# Patient Record
Sex: Female | Born: 1945 | Race: White | Hispanic: No | Marital: Married | Smoking: Never smoker
Health system: Southern US, Community
[De-identification: ages and names within clinical notes are randomized; demographics above are authoritative.]

## PROBLEM LIST (undated history)

## (undated) DIAGNOSIS — J45909 Unspecified asthma, uncomplicated: Secondary | ICD-10-CM

## (undated) DIAGNOSIS — M1990 Unspecified osteoarthritis, unspecified site: Secondary | ICD-10-CM

## (undated) DIAGNOSIS — R112 Nausea with vomiting, unspecified: Secondary | ICD-10-CM

## (undated) DIAGNOSIS — H409 Unspecified glaucoma: Secondary | ICD-10-CM

## (undated) DIAGNOSIS — D649 Anemia, unspecified: Secondary | ICD-10-CM

## (undated) HISTORY — PX: ABDOMINAL HYSTERECTOMY: SHX81

## (undated) HISTORY — PX: BREAST CYST EXCISION: SHX579

## (undated) HISTORY — PX: SHOULDER ARTHROSCOPY: SHX128

---

## 1998-04-17 ENCOUNTER — Ambulatory Visit (HOSPITAL_COMMUNITY): Admission: RE | Admit: 1998-04-17 | Discharge: 1998-04-17 | Payer: Self-pay | Admitting: Family Medicine

## 1999-04-28 ENCOUNTER — Encounter: Payer: Self-pay | Admitting: Family Medicine

## 1999-04-28 ENCOUNTER — Ambulatory Visit (HOSPITAL_COMMUNITY): Admission: RE | Admit: 1999-04-28 | Discharge: 1999-04-28 | Payer: Self-pay | Admitting: Family Medicine

## 2000-05-23 ENCOUNTER — Encounter: Payer: Self-pay | Admitting: Family Medicine

## 2000-05-23 ENCOUNTER — Ambulatory Visit (HOSPITAL_COMMUNITY): Admission: RE | Admit: 2000-05-23 | Discharge: 2000-05-23 | Payer: Self-pay | Admitting: Family Medicine

## 2001-05-29 ENCOUNTER — Encounter: Payer: Self-pay | Admitting: Family Medicine

## 2001-05-29 ENCOUNTER — Ambulatory Visit (HOSPITAL_COMMUNITY): Admission: RE | Admit: 2001-05-29 | Discharge: 2001-05-29 | Payer: Self-pay | Admitting: Family Medicine

## 2002-07-16 ENCOUNTER — Encounter: Payer: Self-pay | Admitting: Family Medicine

## 2002-07-16 ENCOUNTER — Ambulatory Visit (HOSPITAL_COMMUNITY): Admission: RE | Admit: 2002-07-16 | Discharge: 2002-07-16 | Payer: Self-pay | Admitting: Family Medicine

## 2003-07-31 ENCOUNTER — Ambulatory Visit (HOSPITAL_COMMUNITY): Admission: RE | Admit: 2003-07-31 | Discharge: 2003-07-31 | Payer: Self-pay | Admitting: Family Medicine

## 2004-08-14 ENCOUNTER — Ambulatory Visit (HOSPITAL_COMMUNITY): Admission: RE | Admit: 2004-08-14 | Discharge: 2004-08-14 | Payer: Self-pay | Admitting: Family Medicine

## 2004-09-21 ENCOUNTER — Encounter: Admission: RE | Admit: 2004-09-21 | Discharge: 2004-09-21 | Payer: Self-pay | Admitting: Family Medicine

## 2005-08-30 ENCOUNTER — Ambulatory Visit (HOSPITAL_COMMUNITY): Admission: RE | Admit: 2005-08-30 | Discharge: 2005-08-30 | Payer: Self-pay | Admitting: Family Medicine

## 2006-09-06 ENCOUNTER — Ambulatory Visit (HOSPITAL_COMMUNITY): Admission: RE | Admit: 2006-09-06 | Discharge: 2006-09-06 | Payer: Self-pay | Admitting: Family Medicine

## 2007-09-08 ENCOUNTER — Ambulatory Visit (HOSPITAL_COMMUNITY): Admission: RE | Admit: 2007-09-08 | Discharge: 2007-09-08 | Payer: Self-pay | Admitting: Family Medicine

## 2008-09-20 ENCOUNTER — Ambulatory Visit (HOSPITAL_COMMUNITY): Admission: RE | Admit: 2008-09-20 | Discharge: 2008-09-20 | Payer: Self-pay | Admitting: Family Medicine

## 2009-10-07 ENCOUNTER — Ambulatory Visit (HOSPITAL_COMMUNITY): Admission: RE | Admit: 2009-10-07 | Discharge: 2009-10-07 | Payer: Self-pay | Admitting: Family Medicine

## 2010-10-30 ENCOUNTER — Other Ambulatory Visit (HOSPITAL_COMMUNITY): Payer: Self-pay | Admitting: *Deleted

## 2010-10-30 DIAGNOSIS — Z1231 Encounter for screening mammogram for malignant neoplasm of breast: Secondary | ICD-10-CM

## 2010-11-10 ENCOUNTER — Ambulatory Visit (HOSPITAL_COMMUNITY)
Admission: RE | Admit: 2010-11-10 | Discharge: 2010-11-10 | Disposition: A | Discharge status: Home / Self Care | Payer: Federal, State, Local not specified - PPO | Source: Ambulatory Visit | Attending: Family Medicine | Admitting: Family Medicine

## 2010-11-10 DIAGNOSIS — Z1231 Encounter for screening mammogram for malignant neoplasm of breast: Secondary | ICD-10-CM | POA: Insufficient documentation

## 2011-01-15 ENCOUNTER — Ambulatory Visit: Payer: Self-pay | Admitting: Gastroenterology

## 2011-03-26 ENCOUNTER — Ambulatory Visit: Payer: Self-pay | Admitting: Gastroenterology

## 2011-10-01 ENCOUNTER — Ambulatory Visit: Payer: Self-pay | Admitting: Gastroenterology

## 2011-11-10 ENCOUNTER — Other Ambulatory Visit (HOSPITAL_COMMUNITY): Payer: Self-pay | Admitting: Internal Medicine

## 2011-11-10 DIAGNOSIS — Z1231 Encounter for screening mammogram for malignant neoplasm of breast: Secondary | ICD-10-CM

## 2011-11-11 ENCOUNTER — Other Ambulatory Visit (HOSPITAL_COMMUNITY): Payer: Self-pay | Admitting: *Deleted

## 2011-11-11 DIAGNOSIS — Z1231 Encounter for screening mammogram for malignant neoplasm of breast: Secondary | ICD-10-CM

## 2011-12-14 ENCOUNTER — Other Ambulatory Visit (HOSPITAL_COMMUNITY): Payer: Self-pay | Admitting: Internal Medicine

## 2011-12-14 ENCOUNTER — Ambulatory Visit (HOSPITAL_COMMUNITY)
Admission: RE | Admit: 2011-12-14 | Discharge: 2011-12-14 | Disposition: A | Discharge status: Home / Self Care | Payer: Federal, State, Local not specified - PPO | Source: Ambulatory Visit | Attending: Internal Medicine | Admitting: Internal Medicine

## 2011-12-14 DIAGNOSIS — Z1231 Encounter for screening mammogram for malignant neoplasm of breast: Secondary | ICD-10-CM | POA: Insufficient documentation

## 2011-12-30 ENCOUNTER — Ambulatory Visit: Payer: Self-pay | Admitting: Surgery

## 2012-01-06 LAB — PATHOLOGY REPORT

## 2012-08-16 HISTORY — PX: CHOLECYSTECTOMY: SHX55

## 2012-11-28 ENCOUNTER — Other Ambulatory Visit (HOSPITAL_COMMUNITY): Payer: Self-pay | Admitting: Nurse Practitioner

## 2012-11-28 DIAGNOSIS — Z1231 Encounter for screening mammogram for malignant neoplasm of breast: Secondary | ICD-10-CM

## 2012-12-25 ENCOUNTER — Ambulatory Visit (HOSPITAL_COMMUNITY): Payer: Federal, State, Local not specified - PPO

## 2012-12-29 ENCOUNTER — Ambulatory Visit (HOSPITAL_COMMUNITY)
Admission: RE | Admit: 2012-12-29 | Discharge: 2012-12-29 | Disposition: A | Discharge status: Home / Self Care | Payer: Federal, State, Local not specified - PPO | Source: Ambulatory Visit | Attending: Nurse Practitioner | Admitting: Nurse Practitioner

## 2012-12-29 DIAGNOSIS — Z1231 Encounter for screening mammogram for malignant neoplasm of breast: Secondary | ICD-10-CM | POA: Insufficient documentation

## 2013-01-11 IMAGING — NM NUCLEAR MEDICINE HEPATOHBILIARY INCLUDE GB
3 series · 24 of 24 positions shown · non-contrast
Comparison: none

REASON FOR EXAM: RUQ abd pain
COMMENTS:

[Series 1000: gallbladder statics · 4.80mm/px · 11 of 11 slices shown (1 of 2)]
[im 1/11]
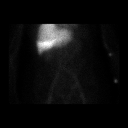
[im 2/11]
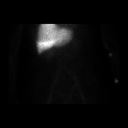
[im 3/11]
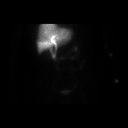
[im 4/11]
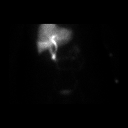
[im 5/11]
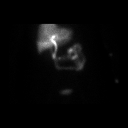
[im 6/11]
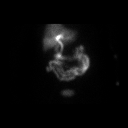
[im 7/11]
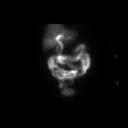
[im 8/11]
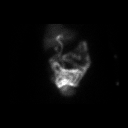
[im 9/11]
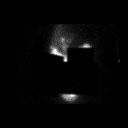
[im 10/11]
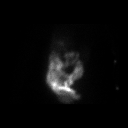
[im 11/11]
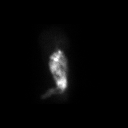

[Series 1000: gallbladder statics · 4.80mm/px · 1 of 1 slices shown (2 of 2)]
[im 1/1]
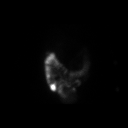

[Series 1000: gallbladder statics (concatenated) · 4.80mm/px · 12 of 12 slices shown]
[im 1/12]
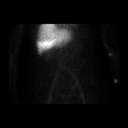
[im 2/12]
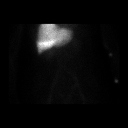
[im 3/12]
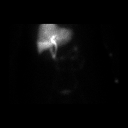
[im 4/12]
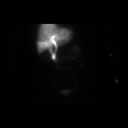
[im 5/12]
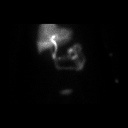
[im 6/12]
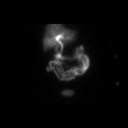
[im 7/12]
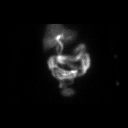
[im 8/12]
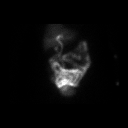
[im 9/12]
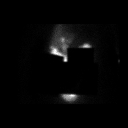
[im 10/12]
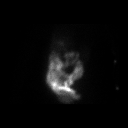
[im 11/12]
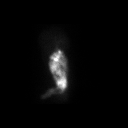
[im 12/12]
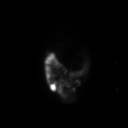

[24 of 24 positions shown; findings below may reference images not displayed]

PROCEDURE:     KNM - KNM HEPATOBILLIARY SCAN  - [DATE] [DATE] [DATE] [DATE]

RESULT:     The patient received 8.307 mCi of technetium 99m labeled
Choletec intravenously. There is adequate uptake of the radiopharmaceutical
by the liver. The common bile duct is visible by 10 minutes. There is
activity to the right of the common bile duct also visible by 10 minutes
that is of uncertain significance. Its contour does not suggest that of a
normal gallbladder. Bowel activity is clearly evident by 20 minutes. The
study was carried [DATE] minutes by which time the liver and common bile
duct exhibited no remaining activity. CCK was not administered.
IMPRESSION: I do not see definite visualization of the gallbladder. An
ultrasound performed today revealed a normal appearing partially distended
gallbladder without evidence of stones. It is possible that the low level
activity to the right of the common bile duct lies in the gallbladder but it
could also lie within the more proximal portion of the duodenum.

## 2013-12-25 ENCOUNTER — Other Ambulatory Visit (HOSPITAL_COMMUNITY): Payer: Self-pay | Admitting: Nurse Practitioner

## 2013-12-25 DIAGNOSIS — Z1231 Encounter for screening mammogram for malignant neoplasm of breast: Secondary | ICD-10-CM

## 2014-01-11 ENCOUNTER — Ambulatory Visit (HOSPITAL_COMMUNITY)
Admission: RE | Admit: 2014-01-11 | Discharge: 2014-01-11 | Disposition: A | Discharge status: Home / Self Care | Payer: Federal, State, Local not specified - PPO | Source: Ambulatory Visit | Attending: Nurse Practitioner | Admitting: Nurse Practitioner

## 2014-01-11 DIAGNOSIS — Z1231 Encounter for screening mammogram for malignant neoplasm of breast: Secondary | ICD-10-CM | POA: Insufficient documentation

## 2014-07-29 ENCOUNTER — Ambulatory Visit: Payer: Self-pay | Admitting: Surgery

## 2014-08-10 ENCOUNTER — Ambulatory Visit: Payer: Self-pay | Admitting: Surgery

## 2014-12-08 NOTE — Op Note (Signed)
PATIENT NAME:  Jacqueline Aguilar, Jacqueline Aguilar MR#:  [id number] DATE OF BIRTH:  01/08/1946  DATE OF PROCEDURE:  12/30/2011  PREOPERATIVE DIAGNOSIS: Cholecystitis.   POSTOPERATIVE DIAGNOSIS: Cholecystitis.   PROCEDURE: Laparoscopic cholecystectomy, cholangiogram.   SURGEON: Renda RollsWilton Calender, MD  ANESTHESIA: General.   INDICATIONS: This 69 year old female has a chief complaint of right upper abdominal pain which has been going on for more than two weeks. She had ultrasound which demonstrated no gallstones but had hepatobiliary scan which was with nonvisualization of the gallbladder. She had right upper quadrant tenderness found on physical exam and surgery was recommended for definitive treatment.   DESCRIPTION OF PROCEDURE: The patient was placed on the operating table in the supine position under general endotracheal anesthesia. The abdomen was prepared with ChloraPrep and draped in a sterile manner.   A short incision was made in the inferior aspect of the umbilicus and carried down to the deep fascia which was grasped with laryngeal hook and elevated. A Veress needle was inserted, aspirated, and irrigated with a saline solution. Next, the peritoneal cavity was insufflated with carbon dioxide. The Veress needle was removed. The 10 mm cannula was inserted. The 10 mm, 0 degree laparoscope was inserted to view the peritoneal cavity. There were some adhesions between the omentum and the lower abdominal wall. The liver appeared normal. Another incision was made in the epigastrium slightly to the right of the midline to introduce an 11 mm cannula. Two incisions were made in the lateral aspect of the right upper quadrant to introduce two 5 mm cannulas.   The gallbladder was retracted towards the right shoulder. Multiple adhesions were taken down with blunt and sharp dissection. The pouch of Jon BillingsMorrison was retracted inferiorly and laterally. The porta hepatis was demonstrated. The neck of the gallbladder was mobilized with  incision of the visceral peritoneum. The cystic duct was dissected free from surrounding structures. The cystic artery was dissected free from surrounding structures. A critical view of safety was demonstrated. An Endo Clip was placed across the cystic duct adjacent to the neck of the gallbladder. An incision was made in the cystic duct to introduce a Reddick catheter. Half-strength Conray-60 dye was injected as the cholangiogram demonstrated the biliary tree and prompt flow of dye into the duodenum. No retained stones were seen. The cholangiogram appeared normal. The Reddick catheter was removed. The cystic duct was doubly ligated with endoclips and divided. The cystic artery was controlled with double endoclips and divided. The gallbladder was dissected free from the liver with use of hook and cautery. One other small branch of the cystic artery was controlled with an endoclip and divided. The gallbladder was completely separated. Hemostasis was intact. The gallbladder was brought out through the infraumbilical incision and submitted in formalin for routine pathology. The right upper quadrant was further inspected. Hemostasis was intact. The cannulas were removed. Carbon dioxide was allowed to escape from the peritoneal cavity. There was some oozing from the inferior most 5 mm port site and these tissues were infiltrated with Xylocaine with epinephrine. Other incisions were infiltrated in a similar manner, pressure was held and hemostasis was subsequently intact. The wounds were closed with interrupted 5-0 chromic subcuticular sutures, benzoin, and Steri-Strips. Dressings were applied with paper tape. The patient tolerated surgery satisfactorily and was then prepared for transfer to the recovery room. ____________________________ Shela CommonsJ. Renda RollsWilton Martinezlopez, MD jws:slb D: 12/30/2011 10:15:02 ET Aguilar: 12/30/2011 10:24:15 ET JOB#: [id number]  cc: Adella HareJ. Wilton Roseboom, MD, <Dictator> Adella HareWILTON J Lessig MD ELECTRONICALLY SIGNED  12/31/2011 17:57 

## 2014-12-25 ENCOUNTER — Other Ambulatory Visit (HOSPITAL_COMMUNITY): Payer: Self-pay | Admitting: Nurse Practitioner

## 2014-12-25 DIAGNOSIS — Z1231 Encounter for screening mammogram for malignant neoplasm of breast: Secondary | ICD-10-CM

## 2015-01-17 ENCOUNTER — Other Ambulatory Visit (HOSPITAL_COMMUNITY): Payer: Self-pay | Admitting: Nurse Practitioner

## 2015-01-17 ENCOUNTER — Ambulatory Visit (HOSPITAL_COMMUNITY)
Admission: RE | Admit: 2015-01-17 | Discharge: 2015-01-17 | Disposition: A | Discharge status: Home / Self Care | Payer: Medicare Other | Source: Ambulatory Visit | Attending: Nurse Practitioner | Admitting: Nurse Practitioner

## 2015-01-17 DIAGNOSIS — Z1231 Encounter for screening mammogram for malignant neoplasm of breast: Secondary | ICD-10-CM

## 2016-01-14 ENCOUNTER — Other Ambulatory Visit: Payer: Self-pay

## 2016-01-14 DIAGNOSIS — Z1231 Encounter for screening mammogram for malignant neoplasm of breast: Secondary | ICD-10-CM

## 2016-01-30 ENCOUNTER — Ambulatory Visit
Admission: RE | Admit: 2016-01-30 | Discharge: 2016-01-30 | Disposition: A | Discharge status: Home / Self Care | Payer: Medicare Other | Source: Ambulatory Visit

## 2016-01-30 ENCOUNTER — Other Ambulatory Visit: Payer: Self-pay | Admitting: Nurse Practitioner

## 2016-01-30 DIAGNOSIS — Z1231 Encounter for screening mammogram for malignant neoplasm of breast: Secondary | ICD-10-CM

## 2016-12-28 ENCOUNTER — Other Ambulatory Visit: Payer: Self-pay | Admitting: Nurse Practitioner

## 2016-12-28 DIAGNOSIS — Z1231 Encounter for screening mammogram for malignant neoplasm of breast: Secondary | ICD-10-CM

## 2017-02-07 ENCOUNTER — Ambulatory Visit
Admission: RE | Admit: 2017-02-07 | Discharge: 2017-02-07 | Disposition: A | Discharge status: Home / Self Care | Payer: Medicare Other | Source: Ambulatory Visit | Attending: Nurse Practitioner | Admitting: Nurse Practitioner

## 2017-02-07 DIAGNOSIS — Z1231 Encounter for screening mammogram for malignant neoplasm of breast: Secondary | ICD-10-CM

## 2017-12-30 ENCOUNTER — Other Ambulatory Visit: Payer: Self-pay | Admitting: Nurse Practitioner

## 2017-12-30 DIAGNOSIS — Z1231 Encounter for screening mammogram for malignant neoplasm of breast: Secondary | ICD-10-CM

## 2018-02-13 ENCOUNTER — Ambulatory Visit
Admission: RE | Admit: 2018-02-13 | Discharge: 2018-02-13 | Disposition: A | Discharge status: Home / Self Care | Payer: Medicare Other | Source: Ambulatory Visit | Attending: Nurse Practitioner | Admitting: Nurse Practitioner

## 2018-02-13 DIAGNOSIS — Z1231 Encounter for screening mammogram for malignant neoplasm of breast: Secondary | ICD-10-CM

## 2019-01-25 ENCOUNTER — Other Ambulatory Visit: Payer: Self-pay | Admitting: Nurse Practitioner

## 2019-01-25 DIAGNOSIS — Z1231 Encounter for screening mammogram for malignant neoplasm of breast: Secondary | ICD-10-CM

## 2019-03-13 ENCOUNTER — Ambulatory Visit: Payer: Medicare Other

## 2019-04-26 ENCOUNTER — Ambulatory Visit
Admission: RE | Admit: 2019-04-26 | Discharge: 2019-04-26 | Disposition: A | Discharge status: Home / Self Care | Payer: Medicare Other | Source: Ambulatory Visit | Attending: Nurse Practitioner | Admitting: Nurse Practitioner

## 2019-04-26 ENCOUNTER — Other Ambulatory Visit: Payer: Self-pay

## 2019-04-26 DIAGNOSIS — Z1231 Encounter for screening mammogram for malignant neoplasm of breast: Secondary | ICD-10-CM

## 2019-09-18 ENCOUNTER — Other Ambulatory Visit: Payer: Self-pay | Admitting: Orthopedic Surgery

## 2019-09-18 DIAGNOSIS — M75112 Incomplete rotator cuff tear or rupture of left shoulder, not specified as traumatic: Secondary | ICD-10-CM

## 2019-09-28 ENCOUNTER — Ambulatory Visit
Admission: RE | Admit: 2019-09-28 | Discharge: 2019-09-28 | Disposition: A | Discharge status: Home / Self Care | Payer: Medicare Other | Source: Ambulatory Visit | Attending: Orthopedic Surgery | Admitting: Orthopedic Surgery

## 2019-09-28 ENCOUNTER — Other Ambulatory Visit: Payer: Self-pay

## 2019-09-28 DIAGNOSIS — M75112 Incomplete rotator cuff tear or rupture of left shoulder, not specified as traumatic: Secondary | ICD-10-CM

## 2019-10-18 ENCOUNTER — Other Ambulatory Visit: Payer: Self-pay | Admitting: Surgery

## 2019-10-26 ENCOUNTER — Encounter
Admission: RE | Admit: 2019-10-26 | Discharge: 2019-10-26 | Disposition: A | Discharge status: Home / Self Care | Payer: Medicare Other | Source: Ambulatory Visit | Attending: Surgery | Admitting: Surgery

## 2019-10-26 ENCOUNTER — Other Ambulatory Visit: Payer: Self-pay

## 2019-10-26 HISTORY — DX: Unspecified asthma, uncomplicated: J45.909

## 2019-10-26 HISTORY — DX: Unspecified glaucoma: H40.9

## 2019-10-26 HISTORY — DX: Nausea with vomiting, unspecified: R11.2

## 2019-10-26 NOTE — Patient Instructions (Signed)
Your procedure is scheduled on: Thursday November 01, 2019 Report to Day Surgery on the 2nd floor of the [facility]. To find out your arrival time, please call [phone] between 1PM - 3PM on: Wednesday October 31, 2019  REMEMBER: Instructions that are not followed completely may result in serious medical risk, up to and including death; or upon the discretion of your surgeon and anesthesiologist your surgery may need to be rescheduled.  Do not eat food after midnight the night before surgery.  No gum chewing, lozengers or hard candies.  You may however, drink CLEAR liquids up to 2 hours before you are scheduled to arrive for your surgery. Do not drink anything within 2 hours of the start of your surgery.  Clear liquids include: - water  - apple juice without pulp - gatorade (not RED) - black coffee or tea (Do NOT add milk or creamers to the coffee or tea) Do NOT drink anything that is not on this list.  Type 1 and Type 2 diabetics should only drink water.  ENSURE PRE-SURGERY CARBOHYDRATE DRINK:  Complete drinking 2 hours prior to scheduled arrival time.  TAKE THESE MEDICATIONS THE MORNING OF SURGERY WITH A SIP OF WATER: none   Use inhalers on the day of surgery   Stop Anti-inflammatories (NSAIDS) such as Advil, Aleve, Ibuprofen, Motrin, Naproxen, Naprosyn and  Aspirin  Or Aspirin based products such as Excedrin, Goodys Powder, BC Powder. (May take Tylenol or Acetaminophen if needed.)  Stop ANY OVER THE COUNTER supplements until after surgery. (May continue Vitamin D, Vitamin B, and multivitamin.)  No Alcohol for 24 hours before or after surgery.  No Smoking including e-cigarettes for 24 hours prior to surgery.  No chewable tobacco products for at least 6 hours prior to surgery.  No nicotine patches on the day of surgery.  On the morning of surgery brush your teeth with toothpaste and water, you may rinse your mouth with mouthwash if you wish. Do not swallow any toothpaste  or mouthwash.  Do not wear jewelry, make-up, hairpins, clips or nail polish.  Do not wear lotions, powders, or perfumes.   Do not shave 48 hours prior to surgery.   Contact lenses, hearing aids and dentures may not be worn into surgery.  Do not bring valuables to the hospital, including drivers license, insurance or credit cards.  [facility] is not responsible for any belongings or valuables.   Use CHG Soap  as directed on instruction sheet.  Notify your doctor if there is any change in your medical condition (cold, fever, infection).  Wear comfortable clothing (specific to your surgery type) to the hospital.  Plan for stool softeners for home use.  If you are being discharged the day of surgery, you will not be allowed to drive home. You will need a responsible adult to drive you home and stay with you that night.   If you are taking public transportation, you will need to have a responsible adult with you. Please confirm with your physician that it is acceptable to use public transportation.   Please call [phone] if you have any questions about these instructions.  Visitation Policy:  Patients undergoing a surgery or procedure in a hospital may have one family member or support person with them as long as that person is not COVID-19 positive or experiencing its symptoms. That person may remain in the waiting area during the procedure. Should the patient need to stay at the hospital during part of their  recovery, the support person may visit during visiting hours; 10 am to 8 pm.  Children under 67 years of age may have both parents or legal guardians with them during their hospital stay.

## 2019-10-30 ENCOUNTER — Other Ambulatory Visit: Payer: Self-pay

## 2019-10-30 ENCOUNTER — Encounter
Admission: RE | Admit: 2019-10-30 | Discharge: 2019-10-30 | Disposition: A | Discharge status: Home / Self Care | Payer: Medicare Other | Source: Ambulatory Visit | Attending: Surgery | Admitting: Surgery

## 2019-10-30 DIAGNOSIS — Z01818 Encounter for other preprocedural examination: Secondary | ICD-10-CM | POA: Diagnosis present

## 2019-10-30 DIAGNOSIS — Z20822 Contact with and (suspected) exposure to covid-19: Secondary | ICD-10-CM | POA: Insufficient documentation

## 2019-10-30 LAB — SARS CORONAVIRUS 2 (TAT 6-24 HRS): SARS Coronavirus 2: NEGATIVE

## 2019-11-01 ENCOUNTER — Encounter
Admission: RE | Disposition: A | Discharge status: Home / Self Care | Payer: Self-pay | Source: Home / Self Care | Attending: Surgery

## 2019-11-01 ENCOUNTER — Other Ambulatory Visit: Payer: Self-pay

## 2019-11-01 ENCOUNTER — Ambulatory Visit
Admission: RE | Admit: 2019-11-01 | Discharge: 2019-11-01 | Disposition: A | Discharge status: Home / Self Care | Payer: Medicare Other | Attending: Surgery | Admitting: Surgery

## 2019-11-01 ENCOUNTER — Ambulatory Visit: Payer: Medicare Other | Admitting: Anesthesiology

## 2019-11-01 ENCOUNTER — Ambulatory Visit: Payer: Medicare Other

## 2019-11-01 ENCOUNTER — Encounter: Payer: Self-pay | Admitting: Surgery

## 2019-11-01 DIAGNOSIS — M1990 Unspecified osteoarthritis, unspecified site: Secondary | ICD-10-CM | POA: Diagnosis not present

## 2019-11-01 DIAGNOSIS — Z882 Allergy status to sulfonamides status: Secondary | ICD-10-CM | POA: Diagnosis not present

## 2019-11-01 DIAGNOSIS — Z79899 Other long term (current) drug therapy: Secondary | ICD-10-CM | POA: Insufficient documentation

## 2019-11-01 DIAGNOSIS — M7522 Bicipital tendinitis, left shoulder: Secondary | ICD-10-CM | POA: Insufficient documentation

## 2019-11-01 DIAGNOSIS — M25812 Other specified joint disorders, left shoulder: Secondary | ICD-10-CM | POA: Insufficient documentation

## 2019-11-01 DIAGNOSIS — Z419 Encounter for procedure for purposes other than remedying health state, unspecified: Secondary | ICD-10-CM

## 2019-11-01 DIAGNOSIS — J45909 Unspecified asthma, uncomplicated: Secondary | ICD-10-CM | POA: Diagnosis not present

## 2019-11-01 DIAGNOSIS — Z888 Allergy status to other drugs, medicaments and biological substances status: Secondary | ICD-10-CM | POA: Diagnosis not present

## 2019-11-01 DIAGNOSIS — M7502 Adhesive capsulitis of left shoulder: Secondary | ICD-10-CM | POA: Insufficient documentation

## 2019-11-01 DIAGNOSIS — M75112 Incomplete rotator cuff tear or rupture of left shoulder, not specified as traumatic: Secondary | ICD-10-CM | POA: Diagnosis present

## 2019-11-01 DIAGNOSIS — H409 Unspecified glaucoma: Secondary | ICD-10-CM | POA: Diagnosis not present

## 2019-11-01 HISTORY — PX: SHOULDER ARTHROSCOPY WITH SUBACROMIAL DECOMPRESSION AND OPEN ROTATOR C: SHX5688

## 2019-11-01 SURGERY — SHOULDER ARTHROSCOPY WITH SUBACROMIAL DECOMPRESSION AND OPEN ROTATOR CUFF REPAIR, OPEN BICEPS TENDON REPAIR
Anesthesia: General | Site: Shoulder | Laterality: Left

## 2019-11-01 MED ORDER — MIDAZOLAM HCL 2 MG/2ML IJ SOLN
INTRAMUSCULAR | Status: AC
  Filled 2019-11-01: qty 2

## 2019-11-01 MED ORDER — PROMETHAZINE HCL 25 MG/ML IJ SOLN: 6.2500 mg | INTRAMUSCULAR | Status: DC | PRN

## 2019-11-01 MED ORDER — ONDANSETRON HCL 4 MG/2ML IJ SOLN
INTRAMUSCULAR | Status: AC
  Filled 2019-11-01: qty 2

## 2019-11-01 MED ORDER — ROCURONIUM BROMIDE 10 MG/ML (PF) SYRINGE
PREFILLED_SYRINGE | INTRAVENOUS | Status: AC
  Filled 2019-11-01: qty 10

## 2019-11-01 MED ORDER — ROCURONIUM BROMIDE 100 MG/10ML IV SOLN
INTRAVENOUS | Status: DC | PRN
  Administered 2019-11-01: 30 mg via INTRAVENOUS

## 2019-11-01 MED ORDER — SUGAMMADEX SODIUM 200 MG/2ML IV SOLN
INTRAVENOUS | Status: DC | PRN
  Administered 2019-11-01: 200 mg via INTRAVENOUS

## 2019-11-01 MED ORDER — CEFAZOLIN SODIUM-DEXTROSE 2-4 GM/100ML-% IV SOLN
2.0000 g | INTRAVENOUS | Status: AC
  Administered 2019-11-01: 2 g via INTRAVENOUS

## 2019-11-01 MED ORDER — BUPIVACAINE HCL (PF) 0.5 % IJ SOLN
INTRAMUSCULAR | Status: AC
  Filled 2019-11-01: qty 30

## 2019-11-01 MED ORDER — FAMOTIDINE 20 MG PO TABS
ORAL_TABLET | ORAL | Status: AC
  Administered 2019-11-01: 11:00:00 20 mg via ORAL
  Filled 2019-11-01: qty 1

## 2019-11-01 MED ORDER — LIDOCAINE HCL (CARDIAC) PF 100 MG/5ML IV SOSY
PREFILLED_SYRINGE | INTRAVENOUS | Status: DC | PRN
  Administered 2019-11-01: 40 mg via INTRAVENOUS

## 2019-11-01 MED ORDER — BUPIVACAINE HCL (PF) 0.5 % IJ SOLN
INTRAMUSCULAR | Status: DC | PRN
  Administered 2019-11-01: 10 mL via PERINEURAL

## 2019-11-01 MED ORDER — CHLORHEXIDINE GLUCONATE 4 % EX LIQD
60.0000 mL | Freq: Once | CUTANEOUS | Status: AC
  Administered 2019-11-01: 4 via TOPICAL

## 2019-11-01 MED ORDER — PROPOFOL 10 MG/ML IV BOLUS
INTRAVENOUS | Status: DC | PRN
  Administered 2019-11-01: 120 mg via INTRAVENOUS

## 2019-11-01 MED ORDER — BUPIVACAINE HCL (PF) 0.5 % IJ SOLN
INTRAMUSCULAR | Status: AC
  Filled 2019-11-01: qty 10

## 2019-11-01 MED ORDER — LACTATED RINGERS IV SOLN
INTRAVENOUS | Status: DC | PRN
  Administered 2019-11-01: 3000 mL

## 2019-11-01 MED ORDER — BUPIVACAINE LIPOSOME 1.3 % IJ SUSP
INTRAMUSCULAR | Status: DC | PRN
  Administered 2019-11-01: 15 mL via PERINEURAL

## 2019-11-01 MED ORDER — LIDOCAINE HCL (PF) 1 % IJ SOLN
INTRAMUSCULAR | Status: AC
  Filled 2019-11-01: qty 5

## 2019-11-01 MED ORDER — EPINEPHRINE PF 1 MG/ML IJ SOLN
INTRAMUSCULAR | Status: AC
  Filled 2019-11-01: qty 1

## 2019-11-01 MED ORDER — DEXAMETHASONE SODIUM PHOSPHATE 10 MG/ML IJ SOLN
INTRAMUSCULAR | Status: DC | PRN
  Administered 2019-11-01: 10 mg via INTRAVENOUS

## 2019-11-01 MED ORDER — ACETAMINOPHEN 325 MG PO TABS: 325.0000 mg | ORAL_TABLET | ORAL | Status: DC | PRN

## 2019-11-01 MED ORDER — LIDOCAINE HCL (PF) 2 % IJ SOLN
INTRAMUSCULAR | Status: AC
  Filled 2019-11-01: qty 10

## 2019-11-01 MED ORDER — SODIUM CHLORIDE 0.9 % IV SOLN
INTRAVENOUS | Status: DC | PRN
  Administered 2019-11-01: 30 ug/min via INTRAVENOUS

## 2019-11-01 MED ORDER — BUPIVACAINE-EPINEPHRINE 0.5% -1:200000 IJ SOLN
INTRAMUSCULAR | Status: DC | PRN
  Administered 2019-11-01: 30 mL

## 2019-11-01 MED ORDER — FENTANYL CITRATE (PF) 100 MCG/2ML IJ SOLN
INTRAMUSCULAR | Status: AC
  Administered 2019-11-01: 50 ug
  Filled 2019-11-01: qty 2

## 2019-11-01 MED ORDER — FENTANYL CITRATE (PF) 100 MCG/2ML IJ SOLN
INTRAMUSCULAR | Status: DC | PRN
  Administered 2019-11-01: 50 ug via INTRAVENOUS

## 2019-11-01 MED ORDER — PROPOFOL 10 MG/ML IV BOLUS
INTRAVENOUS | Status: AC
  Filled 2019-11-01: qty 20

## 2019-11-01 MED ORDER — ACETAMINOPHEN 10 MG/ML IV SOLN
INTRAVENOUS | Status: DC | PRN
  Administered 2019-11-01: 1000 mg via INTRAVENOUS

## 2019-11-01 MED ORDER — CEFAZOLIN SODIUM-DEXTROSE 2-4 GM/100ML-% IV SOLN
INTRAVENOUS | Status: AC
  Filled 2019-11-01: qty 100

## 2019-11-01 MED ORDER — DEXAMETHASONE SODIUM PHOSPHATE 10 MG/ML IJ SOLN
INTRAMUSCULAR | Status: AC
  Filled 2019-11-01: qty 1

## 2019-11-01 MED ORDER — ONDANSETRON HCL 4 MG/2ML IJ SOLN
INTRAMUSCULAR | Status: DC | PRN
  Administered 2019-11-01: 4 mg via INTRAVENOUS

## 2019-11-01 MED ORDER — BUPIVACAINE LIPOSOME 1.3 % IJ SUSP
INTRAMUSCULAR | Status: AC
  Filled 2019-11-01: qty 20

## 2019-11-01 MED ORDER — FAMOTIDINE 20 MG PO TABS: 20.0000 mg | ORAL_TABLET | Freq: Once | ORAL | Status: AC

## 2019-11-01 MED ORDER — LACTATED RINGERS IV SOLN: INTRAVENOUS | Status: DC

## 2019-11-01 MED ORDER — OXYCODONE HCL 5 MG PO TABS: 5.0000 mg | ORAL_TABLET | ORAL | 0 refills | Status: AC | PRN

## 2019-11-01 MED ORDER — FENTANYL CITRATE (PF) 100 MCG/2ML IJ SOLN
INTRAMUSCULAR | Status: AC
  Filled 2019-11-01: qty 2

## 2019-11-01 MED ORDER — ACETAMINOPHEN 160 MG/5ML PO SOLN
325.0000 mg | ORAL | Status: DC | PRN
  Filled 2019-11-01: qty 20.3

## 2019-11-01 MED ORDER — HYDROCODONE-ACETAMINOPHEN 7.5-325 MG PO TABS: 1.0000 | ORAL_TABLET | Freq: Once | ORAL | Status: DC | PRN

## 2019-11-01 MED ORDER — PHENYLEPHRINE HCL (PRESSORS) 10 MG/ML IV SOLN
INTRAVENOUS | Status: DC | PRN
  Administered 2019-11-01 (×2): 100 ug via INTRAVENOUS

## 2019-11-01 SURGICAL SUPPLY — 55 items
ANCH SUT BN ASCP DLV (Anchor) ×1 IMPLANT
ANCH SUT RGNRT REGENETEN (Staple) ×1 IMPLANT
ANCHOR BONE REGENETEN (Anchor) ×2 IMPLANT
ANCHOR TENDON REGENETEN (Staple) ×2 IMPLANT
APL PRP STRL LF DISP 70% ISPRP (MISCELLANEOUS) ×1
BIT DRILL JUGRKNT W/NDL BIT2.9 (DRILL) IMPLANT
BLADE FULL RADIUS 3.5 (BLADE) ×3 IMPLANT
BUR ACROMIONIZER 4.0 (BURR) ×3 IMPLANT
CANNULA SHAVER 8MMX76MM (CANNULA) ×3 IMPLANT
CHLORAPREP W/TINT 26 (MISCELLANEOUS) ×3 IMPLANT
COUNTER NEEDLE 20/40 LG (NEEDLE) ×2 IMPLANT
COVER MAYO STAND REUSABLE (DRAPES) ×3 IMPLANT
COVER WAND RF STERILE (DRAPES) ×3 IMPLANT
DRAPE IMP U-DRAPE 54X76 (DRAPES) ×10 IMPLANT
DRILL JUGGERKNOT W/NDL BIT 2.9 (DRILL) ×6
ELECT CAUTERY BLADE TIP 2.5 (TIP) ×3
ELECT REM PT RETURN 9FT ADLT (ELECTROSURGICAL) ×3
ELECTRODE CAUTERY BLDE TIP 2.5 (TIP) ×1 IMPLANT
ELECTRODE REM PT RTRN 9FT ADLT (ELECTROSURGICAL) ×1 IMPLANT
GAUZE SPONGE 4X4 12PLY STRL (GAUZE/BANDAGES/DRESSINGS) ×3 IMPLANT
GAUZE XEROFORM 1X8 LF (GAUZE/BANDAGES/DRESSINGS) ×3 IMPLANT
GLOVE BIO SURGEON STRL SZ7.5 (GLOVE) ×6 IMPLANT
GLOVE BIO SURGEON STRL SZ8 (GLOVE) ×6 IMPLANT
GLOVE BIOGEL PI IND STRL 8 (GLOVE) ×1 IMPLANT
GLOVE BIOGEL PI INDICATOR 8 (GLOVE) ×2
GLOVE INDICATOR 8.0 STRL GRN (GLOVE) ×3 IMPLANT
GOWN STRL REUS W/ TWL LRG LVL3 (GOWN DISPOSABLE) ×1 IMPLANT
GOWN STRL REUS W/ TWL XL LVL3 (GOWN DISPOSABLE) ×1 IMPLANT
GOWN STRL REUS W/TWL LRG LVL3 (GOWN DISPOSABLE) ×3
GOWN STRL REUS W/TWL XL LVL3 (GOWN DISPOSABLE) ×3
GRASPER SUT 15 45D LOW PRO (SUTURE) IMPLANT
IMPL REGENETEN MEDIUM (Shoulder) IMPLANT
IMPLANT REGENETEN MEDIUM (Shoulder) ×3 IMPLANT
IV LACTATED RINGER IRRG 3000ML (IV SOLUTION) ×6
IV LR IRRIG 3000ML ARTHROMATIC (IV SOLUTION) ×2 IMPLANT
KIT CANNULA 8X76-LX IN CANNULA (CANNULA) IMPLANT
MANIFOLD NEPTUNE II (INSTRUMENTS) ×3 IMPLANT
MASK FACE SPIDER DISP (MASK) ×3 IMPLANT
MAT ABSORB  FLUID 56X50 GRAY (MISCELLANEOUS) ×3
MAT ABSORB FLUID 56X50 GRAY (MISCELLANEOUS) ×1 IMPLANT
PACK ARTHROSCOPY SHOULDER (MISCELLANEOUS) ×3 IMPLANT
PASSER SUT FIRSTPASS SELF (INSTRUMENTS) IMPLANT
SLING ARM LRG DEEP (SOFTGOODS) ×1 IMPLANT
SLING ULTRA II LG (MISCELLANEOUS) ×3 IMPLANT
STAPLER SKIN PROX 35W (STAPLE) ×3 IMPLANT
STRAP SAFETY 5IN WIDE (MISCELLANEOUS) ×3 IMPLANT
SUT ETHIBOND 0 MO6 C/R (SUTURE) ×3 IMPLANT
SUT ULTRABRAID 2 COBRAID 38 (SUTURE) IMPLANT
SUT VIC AB 2-0 CT1 27 (SUTURE) ×6
SUT VIC AB 2-0 CT1 TAPERPNT 27 (SUTURE) ×2 IMPLANT
TAPE MICROFOAM 4IN (TAPE) ×3 IMPLANT
TUBING ARTHRO INFLOW-ONLY STRL (TUBING) ×3 IMPLANT
TUBING CONNECTING 10 (TUBING) ×2 IMPLANT
TUBING CONNECTING 10' (TUBING) ×1
WAND WEREWOLF FLOW 90D (MISCELLANEOUS) ×3 IMPLANT

## 2019-11-01 NOTE — Discharge Instructions (Addendum)
Orthopedic discharge instructions: Keep dressing dry and intact.  May shower after dressing changed on post-op day #4 (Monday).  Cover staples with Band-Aids after drying off. Apply ice frequently to shoulder. Take ibuprofen 600 mg TID with meals for 7-10 days, then as necessary. Take oxycodone as prescribed when needed.  May supplement with ES Tylenol if necessary. Keep shoulder immobilizer on at all times except may remove for bathing purposes. Follow-up in 10-14 days or as scheduled.  AMBULATORY SURGERY  DISCHARGE INSTRUCTIONS   1) The drugs that you were given will stay in your system until tomorrow so for the next 24 hours you should not:  A) Drive an automobile B) Make any legal decisions C) Drink any alcoholic beverage   2) You may resume regular meals tomorrow.  Today it is better to start with liquids and gradually work up to solid foods.  You may eat anything you prefer, but it is better to start with liquids, then soup and crackers, and gradually work up to solid foods.   3) Please notify your doctor immediately if you have any unusual bleeding, trouble breathing, redness and pain at the surgery site, drainage, fever, or pain not relieved by medication.   4) Additional Instructions:   Please contact your physician with any problems or Same Day Surgery at [phone], Monday through Friday 6 am to 4 pm, or [facility] number at [phone].      Interscalene Nerve Block with Exparel  1.  For your surgery you have received an Interscalene Nerve Block with Exparel. 2. Nerve Blocks affect many types of nerves, including nerves that control movement, pain and normal sensation.  You may experience feelings such as numbness, tingling, heaviness, weakness or the inability to move your arm or the feeling or sensation that your arm has "fallen asleep". 3. A nerve block with Exparel can last up to 5 days.  Usually the weakness wears off first.  The tingling  and heaviness usually wear off next.  Finally you may start to notice pain.  Keep in mind that this may occur in any order.  Once a nerve block starts to wear off it is usually completely gone within 60 minutes. 4. ISNB may cause mild shortness of breath, a hoarse voice, blurry vision, unequal pupils, or drooping of the face on the same side as the nerve block.  These symptoms will usually resolve with the numbness.  Very rarely the procedure itself can cause mild seizures. 5. If needed, your surgeon will give you a prescription for pain medication.  It will take about 60 minutes for the oral pain medication to become fully effective.  So, it is recommended that you start taking this medication before the nerve block first begins to wear off, or when you first begin to feel discomfort. 6. Take your pain medication only as prescribed.  Pain medication can cause sedation and decrease your breathing if you take more than you need for the level of pain that you have. 7. Nausea is a common side effect of many pain medications.  You may want to eat something before taking your pain medicine to prevent nausea. 8. After an Interscalene nerve block, you cannot feel pain, pressure or extremes in temperature in the effected arm.  Because your arm is numb it is at an increased risk for injury.  To decrease the possibility of injury, please practice the following:  a. While you are awake change the position of your arm frequently  to prevent too much pressure on any one area for prolonged periods of time. b.  If you have a cast or tight dressing, check the color or your fingers every couple of hours.  Call your surgeon with the appearance of any discoloration (white or blue). c. If you are given a sling to wear before you go home, please wear it  at all times until the block has completely worn off.  Do not get up at night without your sling. d. Please contact [facility] or your surgeon if you do not begin to  regain sensation after 7 days from the surgery.  Anesthesia may be contacted by calling the Same Day Surgery Department, Mon. through Fri., 6 am to 4 pm at [phone].   e. If you experience any other problems or concerns, please contact your surgeon's office. f. If you experience severe or prolonged shortness of breath go to the nearest emergency department.

## 2019-11-01 NOTE — H&P (Signed)
Paper H&P to be scanned into permanent record. H&P reviewed and patient re-examined. No changes. 

## 2019-11-01 NOTE — Anesthesia Procedure Notes (Signed)
Procedure Name: Intubation Date/Time: 11/01/2019 1:16 PM Performed by: Caryl Asp, CRNA Pre-anesthesia Checklist: Patient identified, Patient being monitored, Timeout performed, Emergency Drugs available and Suction available Patient Re-evaluated:Patient Re-evaluated prior to induction Oxygen Delivery Method: Circle system utilized Preoxygenation: Pre-oxygenation with 100% oxygen Induction Type: IV induction Ventilation: Mask ventilation without difficulty and Oral airway inserted - appropriate to patient size Laryngoscope Size: Mac and 3 Grade View: Grade I Tube type: Oral Tube size: 7.0 mm Number of attempts: 1 Airway Equipment and Method: Stylet and Video-laryngoscopy Placement Confirmation: ETT inserted through vocal cords under direct vision,  positive ETCO2 and breath sounds checked- equal and bilateral Secured at: 20 cm Tube secured with: Tape Dental Injury: Teeth and Oropharynx as per pre-operative assessment

## 2019-11-01 NOTE — Anesthesia Preprocedure Evaluation (Signed)
Anesthesia Evaluation  Patient identified by MRN, date of birth, ID band Patient awake    Reviewed: Allergy & Precautions, H&P , NPO status , reviewed documented beta blocker date and time   History of Anesthesia Complications (+) PONV and history of anesthetic complications  Airway Mallampati: II  TM Distance: >3 FB Neck ROM: full    Dental  (+) Upper Dentures, Partial Lower, Chipped, Missing   Pulmonary asthma ,    Pulmonary exam normal        Cardiovascular Normal cardiovascular exam     Neuro/Psych    GI/Hepatic neg GERD  ,  Endo/Other    Renal/GU      Musculoskeletal   Abdominal   Peds  Hematology   Anesthesia Other Findings Past Medical History: No date: Asthma No date: Glaucoma No date: PONV (postoperative nausea and vomiting) Past Surgical History: No date: ABDOMINAL HYSTERECTOMY No date: BREAST CYST EXCISION; Left     Comment:  Years ago, No scar seen  2014: CHOLECYSTECTOMY No date: SHOULDER ARTHROSCOPY; Left BMI    Body Mass Index: 25.79 kg/m     Reproductive/Obstetrics                             Anesthesia Physical Anesthesia Plan  ASA: II  Anesthesia Plan: General   Post-op Pain Management:  Regional for Post-op pain   Induction: Intravenous  PONV Risk Score and Plan: Ondansetron and Treatment may vary due to age or medical condition  Airway Management Planned: Oral ETT  Additional Equipment:   Intra-op Plan:   Post-operative Plan: Extubation in OR  Informed Consent: I have reviewed the patients History and Physical, chart, labs and discussed the procedure including the risks, benefits and alternatives for the proposed anesthesia with the patient or authorized representative who has indicated his/her understanding and acceptance.     Dental Advisory Given  Plan Discussed with: CRNA  Anesthesia Plan Comments:         Anesthesia Quick  Evaluation

## 2019-11-01 NOTE — Op Note (Signed)
11/01/2019  2:49 PM  Patient:   Jacqueline Aguilar  Pre-Op Diagnosis:   Impingement/tendinopathy with nontraumatic partial-thickness rotator cuff tear and biceps tendinopathy, left shoulder.  Post-Op Diagnosis:   Impingement/tendinopathy with nontraumatic partial-thickness rotator cuff tear, adhesive capsulitis, and biceps tendinopathy, left shoulder.  Procedure:   Extensive arthroscopic debridement, arthroscopic subacromial decompression, mini-open rotator cuff repair using a Lumbra & Nephew Regeneten patch, manipulation under anesthesia, and mini-open biceps tenodesis, left shoulder.  Anesthesia:   General endotracheal with interscalene block using Exparel placed preoperatively by the anesthesiologist.  Surgeon:   Pascal Lux, MD  Assistant:   Cameron Proud, PA-C  Findings:   As above. There was a bursal-sided partial-thickness tear involving the mid portion of the supraspinatus tendon. The remainder the rotator cuff was in satisfactory condition. There was extensive inflammation along the biceps tendon without any partial or full-thickness tearing. 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 The remainder of the articular surfaces were in satisfactory condition, as was the labrum.  Prior to manipulation, the left shoulder could be forward flexed to 145 and abducted to 135. At 90 of abduction, the shoulder could be externally rotated to 70 and internally rotated to 60. Following manipulation, the shoulder could be forward flexed to 165, abducted to 160 and, at 90 of abduction, externally rotated to 90 and internally rotated to 65.  Complications:   None  Fluids:   700 cc  Estimated blood loss:   5 cc  Tourniquet time:   None  Drains:   None  Closure:   Staples      Brief clinical note:   The patient is a 74 year old female with a history of progressively worsening left  shoulder pain and limited motion. The patient's symptoms have progressed despite medications, activity modification, etc. The patient's history and examination are consistent with impingement/tendinopathy with a possible rotator cuff tear. The preoperative MRI scan also indicated the presence of a bursal sided partial-thickness rotator cuff tear. The patient presents at this time for definitive management of these shoulder symptoms.  Procedure:   The patient underwent placement of an interscalene block using Exparel by the anesthesiologist in the preoperative holding area before being brought into the operating room and lain in the supine position. The patient then underwent general endotracheal intubation and anesthesia before being repositioned in the beach chair position using the beach chair positioner. The left shoulder and upper extremity were prepped with ChloraPrep solution before being draped sterilely. Preoperative antibiotics were administered. A timeout was performed to confirm the proper surgical site before the expected portal sites and incision site were injected with 0.5% Sensorcaine with epinephrine. A posterior portal was created and the glenohumeral joint thoroughly inspected with the findings as described above. An anterior portal was created using an outside-in technique. The labrum and rotator cuff were further probed, again confirming the above-noted findings. The areas of extensive synovitis were debrided back to stable margins using the full-radius resector. The ArthroCare wand was inserted and used to release the biceps tendon from its labral anchor. It also was used to debride the rotator interval as well as to debride additional areas of extensive synovitis anteriorly, superiorly, and posteriorly. It also was used to obtain hemostasis. The instruments were removed from the joint after suctioning the excess fluid.  The camera was repositioned through the posterior portal into the  subacromial space. A separate lateral portal was created using an  outside-in technique. The 3.5 mm full-radius resector was introduced and used to perform a subtotal bursectomy. The ArthroCare wand was then inserted and used to remove the periosteal tissue off the undersurface of the anterior third of the acromion as well as to recess the coracoacromial ligament from its attachment along the anterior and lateral margins of the acromion. The 4.0 mm acromionizing bur was introduced and used to complete the decompression by removing the undersurface of the anterior third of the acromion. The full radius resector was reintroduced to remove any residual bony debris before the ArthroCare wand was reintroduced to obtain hemostasis. The instruments were then removed from the subacromial space after suctioning the excess fluid.  At this point, given that the rotator interval and any subacromial adhesions had been released, it was felt safe to perform a manipulation under anesthesia. The left shoulder was gently manipulated in both abduction and external rotation, as well as adduction and internal rotation. Several palpable and audible pops were heard as the scar tissue released, permitting full range of motion of the shoulder.  An approximately 4-5 cm incision was made over the anterolateral aspect of the shoulder beginning at the anterolateral corner of the acromion and extending distally in line with the bicipital groove. This incision was carried down through the subcutaneous tissues to expose the deltoid fascia. The raphae between the anterior and middle thirds was identified and this plane developed to provide access into the subacromial space. Additional bursal tissues were debrided sharply using Metzenbaum scissors. The rotator cuff tear was readily identified both visually and by palpation. Given the patient's adhesive capsulitis, it was felt best to minimize any reason to keep the patient immobilized. Therefore,  the decision was made simply to apply a Yahoo regenerative patch over the area of partial-thickness tearing to try to stimulate healing rather than to take down the partial thickness tear and perform a formal repair. The medium sized patch was selected and secured using the appropriate bone and soft tissue staples.  The bicipital groove was identified by palpation and opened for 1-1.5 cm. The biceps tendon stump was retrieved through this defect. The floor of the bicipital groove was roughened with a curet before a single Biomet 2.9 mm JuggerKnot anchor was inserted. Both sets of sutures were passed through the biceps tendon and tied securely to effect the tenodesis. The bicipital sheath was reapproximated using two #0 Ethibond interrupted sutures, incorporating the biceps tendon to further reinforce the tenodesis.  The wound was copiously irrigated with sterile saline solution before the deltoid raphae was reapproximated using 2-0 Vicryl interrupted sutures. The subcutaneous tissues were closed in two layers using 2-0 Vicryl interrupted sutures before the skin was closed using staples. The portal sites also were closed using staples. A sterile bulky dressing was applied to the shoulder before the arm was placed into a shoulder immobilizer. The patient was then awakened, extubated, and returned to the recovery room in satisfactory condition after tolerating the procedure well.

## 2019-11-01 NOTE — Transfer of Care (Signed)
Immediate Anesthesia Transfer of Care Note  Patient: Jacqueline Aguilar  Procedure(s) Performed: LEFT SHOULDER ARTHROSCOPY WITH DEBRIDEMENT, DECOMPRESSION, BICEPS TENODESIS AND PROBABLE ROTATOR CUFF REPAIR (Left Shoulder)  Patient Location: PACU  Anesthesia Type:General  Level of Consciousness: awake, alert  and oriented  Airway & Oxygen Therapy: Patient Spontanous Breathing and Patient connected to face mask oxygen  Post-op Assessment: Report given to RN and Post -op Vital signs reviewed and stable  Post vital signs: Reviewed and stable  Last Vitals:  Vitals Value Taken Time  BP 145/65 11/01/19 1500  Temp 36.4 C 11/01/19 1500  Pulse 83 11/01/19 1503  Resp 21 11/01/19 1503  SpO2 100 % 11/01/19 1503  Vitals shown include unvalidated device data.  Last Pain:  Vitals:   11/01/19 1100  PainSc: 4       Patients Stated Pain Goal: 0 (11/01/19 1100)  Complications: No apparent anesthesia complications

## 2019-11-01 NOTE — Anesthesia Procedure Notes (Signed)
Anesthesia Regional Block: Interscalene brachial plexus block   Pre-Anesthetic Checklist: ,, timeout performed, Correct Patient, Correct Site, Correct Laterality, Correct Procedure, Correct Position, site marked, Risks and benefits discussed,  Surgical consent,  Pre-op evaluation,  At surgeon's request and post-op pain management  Laterality: Upper and Left  Prep: alcohol swabs       Needles:  Injection technique: Single-shot  Needle Type: Echogenic Stimulator Needle     Needle Length: 10cm  Needle Gauge: 21   Needle insertion depth: 6 cm   Additional Needles:   Procedures: Doppler guided,,,, ultrasound used (permanent image in chart),,,,  Narrative:  Start time: 11/01/2019 12:41 PM End time: 11/01/2019 12:44 PM Injection made incrementally with aspirations every 5 mL.  Performed by: Personally  Anesthesiologist: Christia Reading, MD  Additional Notes: Functioning IV was confirmed and O2 [place]/monitors were applied. Light sedation administered as required, patient responsive throughout. A 21ga EchoStim needle was used. Sterile prep and drape,hand hygiene and sterile gloves were used.  Negative aspiration and negative test dose prior to incremental administration of local anesthetic. 1% Lidocaine for skin wheal, 4 ml. Total LA: 66ml - Exparel 73ml & 0.5% Bupivicaine 49ml. U/S images stored in chart. The patient tolerated the procedure well.

## 2019-11-01 NOTE — Progress Notes (Signed)
Patient has a dry nagging cough. Did receive an exparel intrascalene nerve block. Spoke with Dr Karlton Lemon, states the cough could be from the exparel block. Patient denies any sore throat. spO2 97% on room air. NAD.

## 2019-11-08 NOTE — Anesthesia Postprocedure Evaluation (Signed)
Anesthesia Post Note  Patient: Jacqueline Aguilar  Procedure(s) Performed: LEFT SHOULDER ARTHROSCOPY WITH DEBRIDEMENT, DECOMPRESSION, BICEPS TENODESIS AND PROBABLE ROTATOR CUFF REPAIR (Left Shoulder)  Patient location during evaluation: PACU Anesthesia Type: General Level of consciousness: awake and alert Pain management: pain level controlled Vital Signs Assessment: post-procedure vital signs reviewed and stable Respiratory status: spontaneous breathing, nonlabored ventilation, respiratory function stable and patient connected to nasal cannula oxygen Cardiovascular status: blood pressure returned to baseline and stable Postop Assessment: no apparent nausea or vomiting Anesthetic complications: no     Last Vitals:  Vitals:   11/01/19 1528 11/01/19 1536  BP: (!) 162/81 (!) 161/98  Pulse: 82 95  Resp: 19 20  Temp: (!) 36.2 C (!) 36.1 C  SpO2: 98% 96%    Last Pain:  Vitals:   11/02/19 0908  TempSrc:   PainSc: 0-No pain                 Christia Reading

## 2020-03-18 ENCOUNTER — Other Ambulatory Visit: Payer: Self-pay | Admitting: Nurse Practitioner

## 2020-03-18 DIAGNOSIS — Z1231 Encounter for screening mammogram for malignant neoplasm of breast: Secondary | ICD-10-CM

## 2020-04-30 ENCOUNTER — Ambulatory Visit
Admission: RE | Admit: 2020-04-30 | Discharge: 2020-04-30 | Disposition: A | Discharge status: Home / Self Care | Payer: Medicare Other | Source: Ambulatory Visit | Attending: Nurse Practitioner | Admitting: Nurse Practitioner

## 2020-04-30 ENCOUNTER — Other Ambulatory Visit: Payer: Self-pay

## 2020-04-30 DIAGNOSIS — Z1231 Encounter for screening mammogram for malignant neoplasm of breast: Secondary | ICD-10-CM

## 2020-08-06 IMAGING — MG MM DIGITAL SCREENING BILAT W/ TOMO W/ CAD
8 series · 8 of 24 positions shown · non-contrast
Comparison: Previous exam(s).

CLINICAL DATA: Screening.

EXAM:
DIGITAL SCREENING BILATERAL MAMMOGRAM WITH TOMO AND CAD

[L MLO synth-2D]
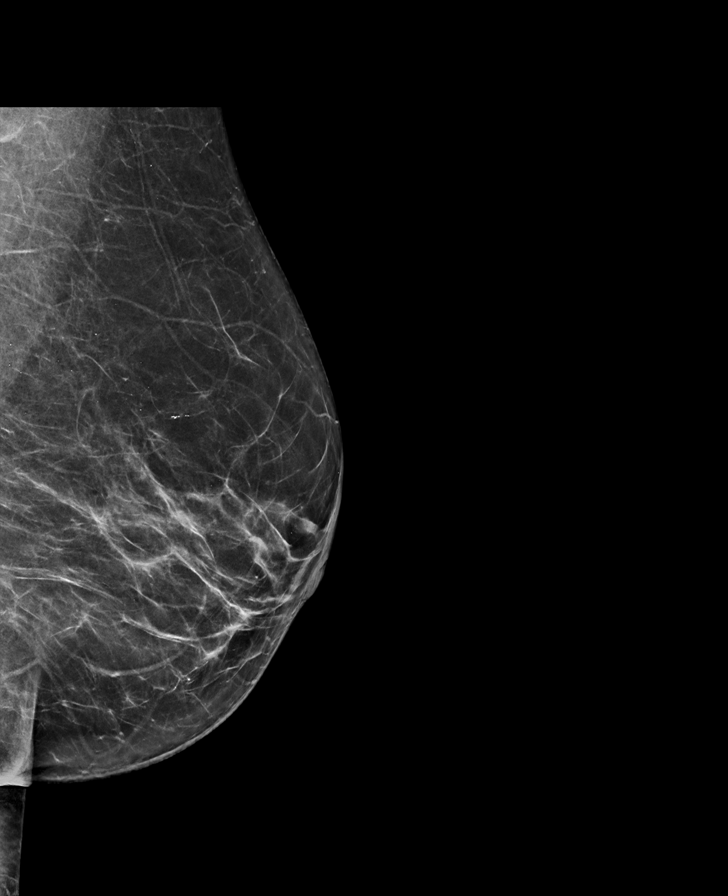

[R MLO synth-2D]
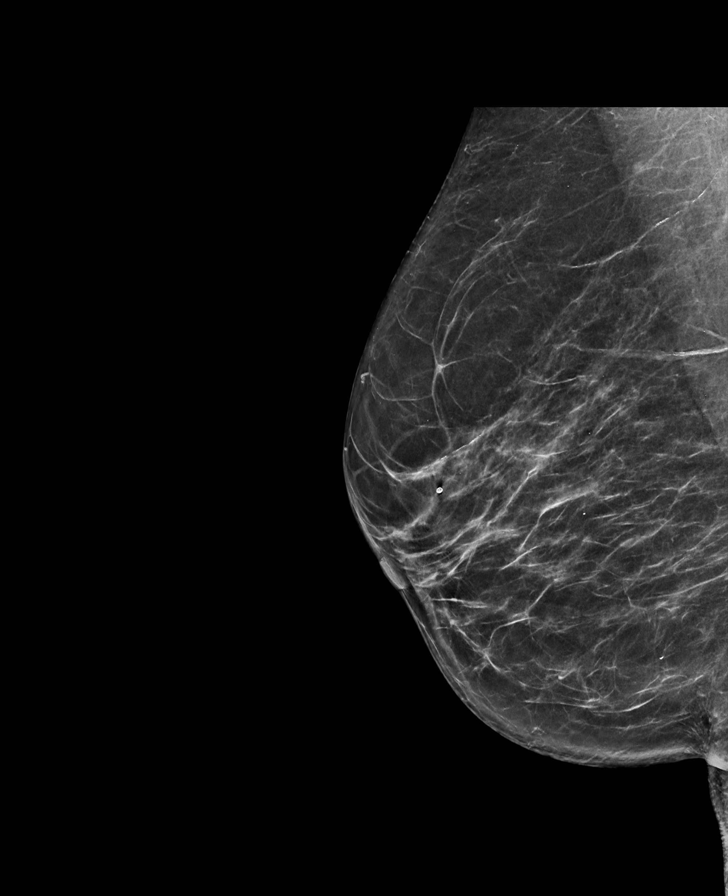

[R CC synth-2D]
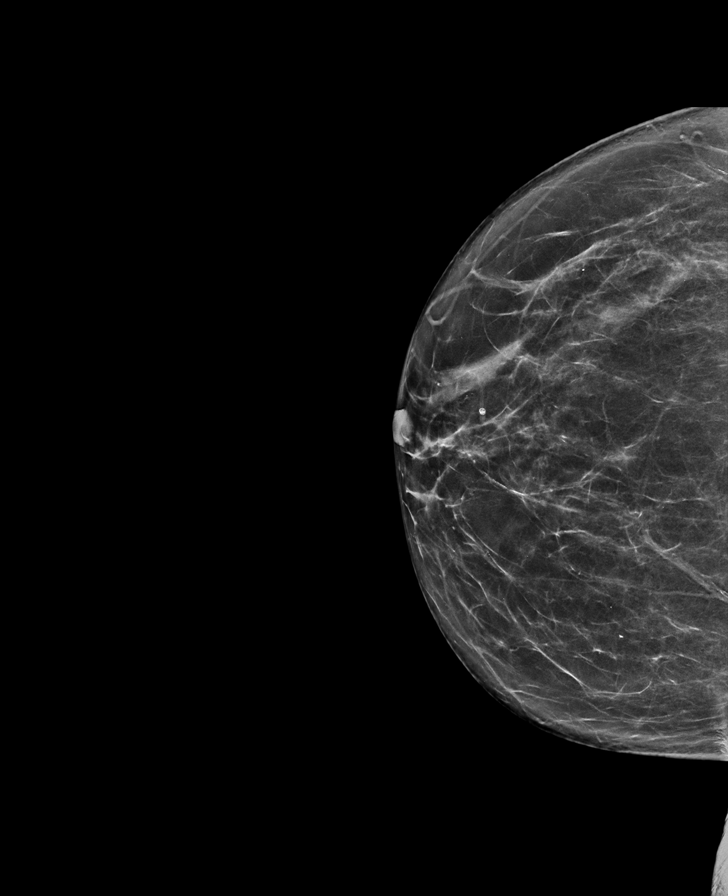

[L CC synth-2D]
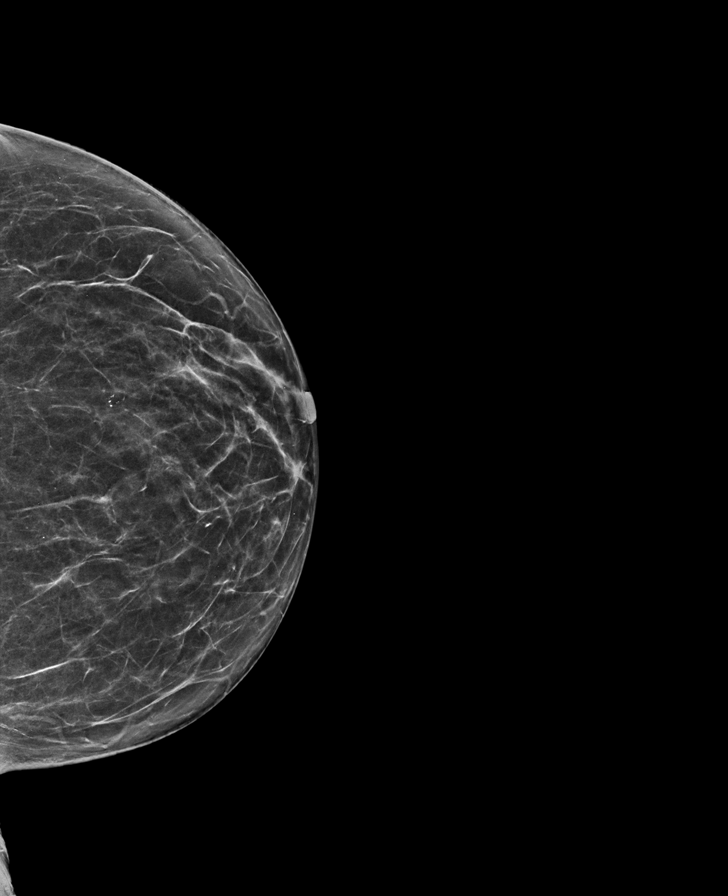

[L MLO tomo · tomo slice 37/72.0]
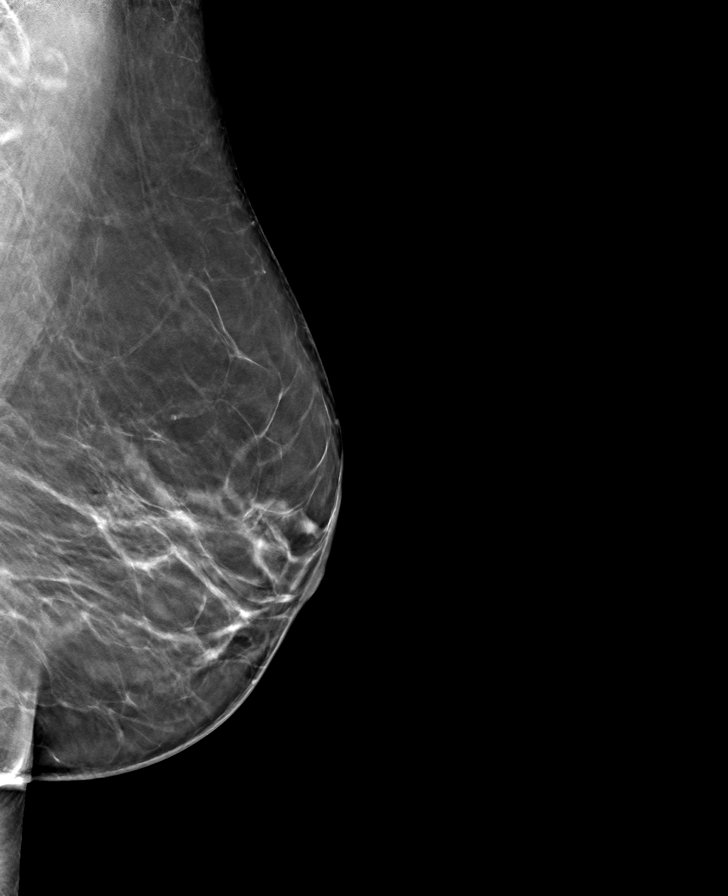

[L CC tomo · tomo slice 32/63.0]
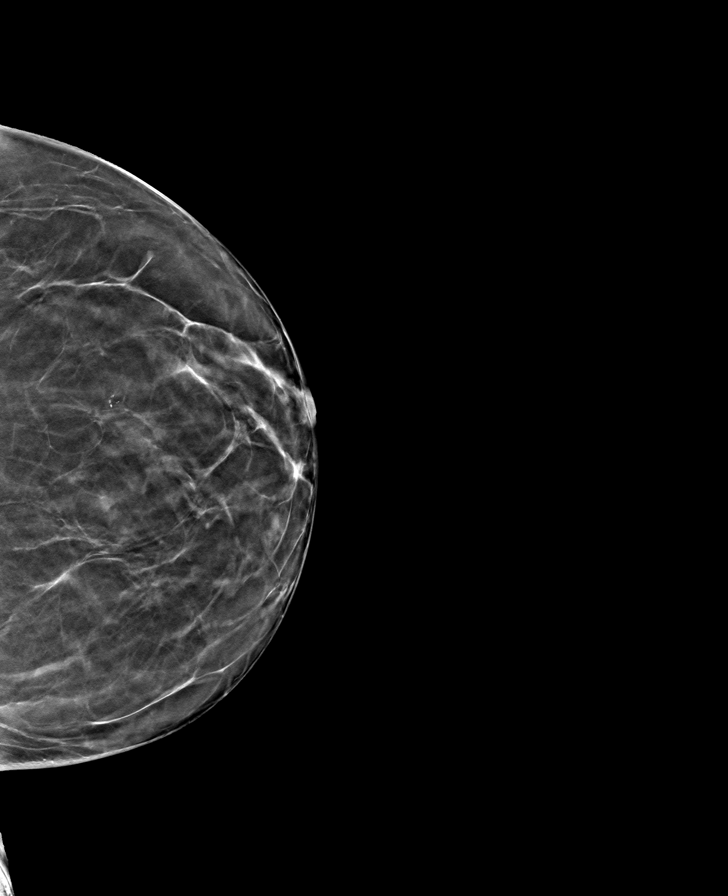

[R CC tomo · tomo slice 36/71.0]
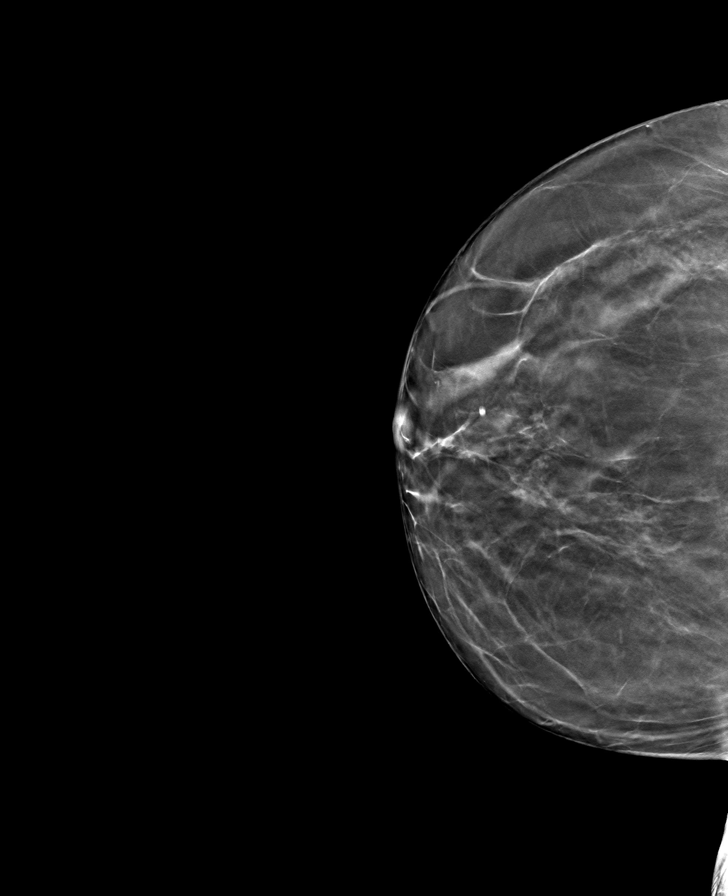

[R MLO tomo · tomo slice 35/69.0]
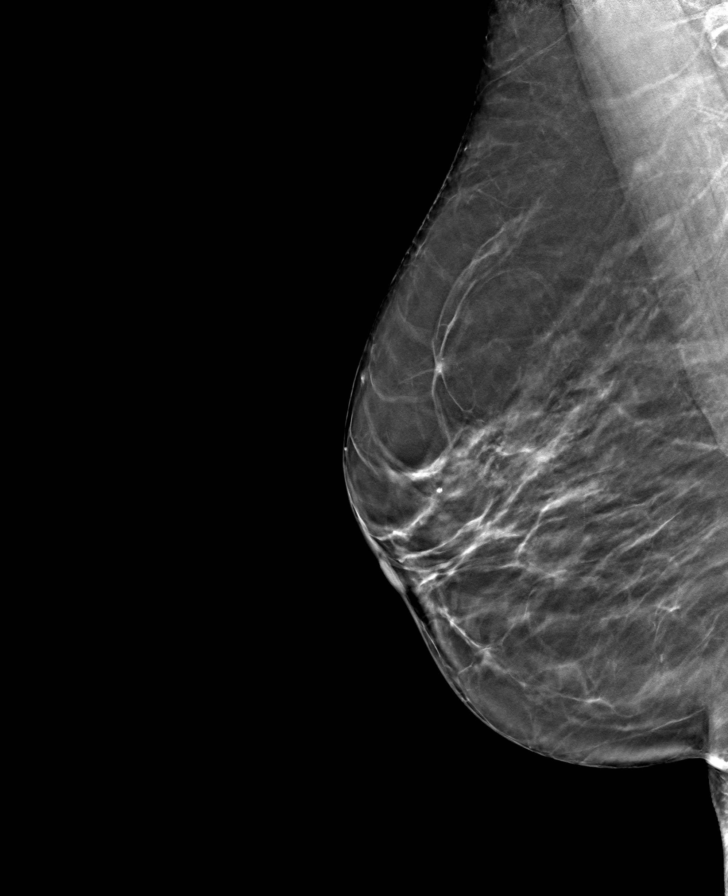

[8 of 24 positions shown; findings below may reference images not displayed]

ACR Breast Density Category b: There are scattered areas of
fibroglandular density.
FINDINGS: There are no findings suspicious for malignancy. Images were
processed with CAD.
IMPRESSION: No mammographic evidence of malignancy. A result letter of this
screening mammogram will be mailed directly to the patient.

RECOMMENDATION:
Screening mammogram in one year. (Code:[id number])

BI-RADS CATEGORY  1: Negative.

## 2021-03-20 ENCOUNTER — Other Ambulatory Visit: Payer: Self-pay | Admitting: Nurse Practitioner

## 2021-03-20 DIAGNOSIS — Z1231 Encounter for screening mammogram for malignant neoplasm of breast: Secondary | ICD-10-CM

## 2021-05-12 ENCOUNTER — Other Ambulatory Visit: Payer: Self-pay

## 2021-05-12 ENCOUNTER — Ambulatory Visit
Admission: RE | Admit: 2021-05-12 | Discharge: 2021-05-12 | Disposition: A | Discharge status: Home / Self Care | Payer: Medicare Other | Source: Ambulatory Visit | Attending: Nurse Practitioner | Admitting: Nurse Practitioner

## 2021-05-12 DIAGNOSIS — Z1231 Encounter for screening mammogram for malignant neoplasm of breast: Secondary | ICD-10-CM

## 2021-08-16 HISTORY — PX: OTHER SURGICAL HISTORY: SHX169

## 2022-03-19 ENCOUNTER — Encounter: Payer: Self-pay | Admitting: Gastroenterology

## 2022-03-21 ENCOUNTER — Encounter: Payer: Self-pay | Admitting: Gastroenterology

## 2022-03-21 NOTE — H&P (Signed)
Pre-Procedure H&P   Patient ID: Jacqueline Aguilar is a 76 y.o. female.  Gastroenterology Provider: Jaynie Collins, DO  Referring Provider: Tawni Pummel, PA PCP: Marin Comment, FNP  Date: 03/22/2022  HPI Jacqueline Aguilar is a 76 y.o. female who presents today for Colonoscopy for Screening colonoscopy. Patient typically has a bowel movement every other day unless increasing stress.  Denies any abdominal pain melena or hematochezia. Status postcholecystectomy and hysterectomy Last colonoscopy in 2012 normal per chart review.  No family history of colon cancer or colon polyps   Past Medical History:  Diagnosis Date   Anemia    Arthritis    Asthma    Glaucoma    PONV (postoperative nausea and vomiting)     Past Surgical History:  Procedure Laterality Date   ABDOMINAL HYSTERECTOMY     BREAST CYST EXCISION Left    Years ago, No scar seen    CHOLECYSTECTOMY  2014   SHOULDER ARTHROSCOPY Left    SHOULDER ARTHROSCOPY WITH SUBACROMIAL DECOMPRESSION AND OPEN ROTATOR C Left 11/01/2019   Procedure: LEFT SHOULDER ARTHROSCOPY WITH DEBRIDEMENT, DECOMPRESSION, BICEPS TENODESIS AND PROBABLE ROTATOR CUFF REPAIR;  Surgeon: Christena Flake, MD;  Location: [facility] ORS;  Service: Orthopedics;  Laterality: Left;    Family History No h/o GI disease or malignancy  Review of Systems  Constitutional:  Negative for activity change, appetite change, chills, diaphoresis, fatigue, fever and unexpected weight change.  HENT:  Negative for trouble swallowing and voice change.   Respiratory:  Negative for shortness of breath and wheezing.   Cardiovascular:  Negative for chest pain, palpitations and leg swelling.  Gastrointestinal:  Negative for abdominal distention, abdominal pain, anal bleeding, blood in stool, constipation, diarrhea, nausea, rectal pain and vomiting.  Musculoskeletal:  Negative for arthralgias and myalgias.  Skin:  Negative for color change and pallor.  Neurological:  Negative for  dizziness, syncope and weakness.  Psychiatric/Behavioral:  Negative for confusion.   All other systems reviewed and are negative.    Medications No current facility-administered medications on file prior to encounter.   Current Outpatient Medications on File Prior to Encounter  Medication Sig Dispense Refill   calcium elemental as carbonate (TUMS ULTRA 1000) 400 MG chewable tablet Chew 1,000 mg by mouth every 2 (two) hours as needed for heartburn.     ipratropium (ATROVENT) 0.06 % nasal spray Place 2 sprays into both nostrils 3 (three) times daily as needed for rhinitis.     Multiple Vitamin (MULTI-VITAMIN) tablet Take 1 tablet by mouth daily.     acetaminophen (TYLENOL) 325 MG tablet Take 650 mg by mouth every 6 (six) hours as needed for headache.     albuterol (VENTOLIN HFA) 108 (90 Base) MCG/ACT inhaler Inhale 1-2 puffs into the lungs every 6 (six) hours as needed for wheezing or shortness of breath. (Patient not taking: Reported on 03/22/2022)     oxyCODONE (ROXICODONE) 5 MG immediate release tablet Take 1-2 tablets (5-10 mg total) by mouth every 4 (four) hours as needed for moderate pain or severe pain. 50 tablet 0   Tafluprost, PF, 0.0015 % SOLN Place 1 drop into both eyes at bedtime.      Pertinent medications related to GI and procedure were reviewed by me with the patient prior to the procedure   Current Facility-Administered Medications:    0.9 %  sodium chloride infusion, , Intravenous, Continuous, Jaynie Collins, DO, Last Rate: 20 mL/hr at 03/22/22 0944, New Bag at 03/22/22 [phone]  Allergies  Allergen Reactions   Dorzolamide Hcl-Timolol Mal Shortness Of Breath   Latanoprost-Timolol Maleate Shortness Of Breath   Prednisone     sunburn   Statins Nausea And Vomiting   Allergies were reviewed by me prior to the procedure  Objective   Body mass index is 25.61 kg/m. Vitals:   03/22/22 0934  BP: (!) 140/76  Pulse: 68  Resp: 20  Temp: (!) 96.6 F (35.9 C)   TempSrc: Temporal  SpO2: 98%  Weight: 63.5 kg  Height: 5\' 2"  (1.575 m)     Physical Exam Vitals and nursing note reviewed.  Constitutional:      General: She is not in acute distress.    Appearance: Normal appearance. She is not ill-appearing, toxic-appearing or diaphoretic.  HENT:     Head: Normocephalic and atraumatic.     Nose: Nose normal.     Mouth/Throat:     Mouth: Mucous membranes are moist.     Pharynx: Oropharynx is clear.  Eyes:     General: No scleral icterus.    Extraocular Movements: Extraocular movements intact.  Cardiovascular:     Rate and Rhythm: Normal rate and regular rhythm.     Heart sounds: Normal heart sounds. No murmur heard.    No friction rub. No gallop.  Pulmonary:     Effort: Pulmonary effort is normal. No respiratory distress.     Breath sounds: Normal breath sounds. No wheezing, rhonchi or rales.  Abdominal:     General: Bowel sounds are normal. There is no distension.     Palpations: Abdomen is soft.     Tenderness: There is no abdominal tenderness. There is no guarding or rebound.  Musculoskeletal:     Cervical back: Neck supple.     Right lower leg: No edema.     Left lower leg: No edema.  Skin:    General: Skin is warm and dry.     Coloration: Skin is not jaundiced or pale.  Neurological:     General: No focal deficit present.     Mental Status: She is alert and oriented to person, place, and time. Mental status is at baseline.  Psychiatric:        Mood and Affect: Mood normal.        Behavior: Behavior normal.        Thought Content: Thought content normal.        Judgment: Judgment normal.      Assessment:  Jacqueline Aguilar is a 76 y.o. female  who presents today for Colonoscopy for Screening colonoscopy.  Plan:  Colonoscopy with possible intervention today  Colonoscopy with possible biopsy, control of bleeding, polypectomy, and interventions as necessary has been discussed with the patient/patient representative.  Informed consent was obtained from the patient/patient representative after explaining the indication, nature, and risks of the procedure including but not limited to death, bleeding, perforation, missed neoplasm/lesions, cardiorespiratory compromise, and reaction to medications. Opportunity for questions was given and appropriate answers were provided. Patient/patient representative has verbalized understanding is amenable to undergoing the procedure.   68, DO  [facility] Gastroenterology  Portions of the record may have been created with voice recognition software. Occasional wrong-word or 'sound-a-like' substitutions may have occurred due to the inherent limitations of voice recognition software.  Read the chart carefully and recognize, using context, where substitutions may have occurred.

## 2022-03-22 ENCOUNTER — Ambulatory Visit
Admission: RE | Admit: 2022-03-22 | Discharge: 2022-03-22 | Disposition: A | Discharge status: Home / Self Care | Payer: Medicare Other | Attending: Gastroenterology | Admitting: Gastroenterology

## 2022-03-22 ENCOUNTER — Other Ambulatory Visit: Payer: Self-pay

## 2022-03-22 ENCOUNTER — Encounter: Payer: Self-pay | Admitting: Gastroenterology

## 2022-03-22 ENCOUNTER — Ambulatory Visit: Payer: Medicare Other | Admitting: General Practice

## 2022-03-22 ENCOUNTER — Encounter
Admission: RE | Disposition: A | Discharge status: Home / Self Care | Payer: Self-pay | Source: Home / Self Care | Attending: Gastroenterology

## 2022-03-22 DIAGNOSIS — K621 Rectal polyp: Secondary | ICD-10-CM | POA: Diagnosis not present

## 2022-03-22 DIAGNOSIS — Z1211 Encounter for screening for malignant neoplasm of colon: Secondary | ICD-10-CM | POA: Insufficient documentation

## 2022-03-22 DIAGNOSIS — K5730 Diverticulosis of large intestine without perforation or abscess without bleeding: Secondary | ICD-10-CM | POA: Diagnosis not present

## 2022-03-22 DIAGNOSIS — K640 First degree hemorrhoids: Secondary | ICD-10-CM | POA: Insufficient documentation

## 2022-03-22 DIAGNOSIS — J45909 Unspecified asthma, uncomplicated: Secondary | ICD-10-CM | POA: Diagnosis not present

## 2022-03-22 HISTORY — PX: COLONOSCOPY WITH PROPOFOL: SHX5780

## 2022-03-22 HISTORY — DX: Anemia, unspecified: D64.9

## 2022-03-22 HISTORY — DX: Unspecified osteoarthritis, unspecified site: M19.90

## 2022-03-22 SURGERY — COLONOSCOPY WITH PROPOFOL
Anesthesia: General

## 2022-03-22 MED ORDER — PROPOFOL 10 MG/ML IV BOLUS
INTRAVENOUS | Status: DC | PRN
  Administered 2022-03-22: 50 mg via INTRAVENOUS

## 2022-03-22 MED ORDER — SODIUM CHLORIDE 0.9 % IV SOLN: INTRAVENOUS | Status: DC

## 2022-03-22 MED ORDER — LIDOCAINE HCL (CARDIAC) PF 100 MG/5ML IV SOSY
PREFILLED_SYRINGE | INTRAVENOUS | Status: DC | PRN
  Administered 2022-03-22: 50 mg via INTRAVENOUS

## 2022-03-22 MED ORDER — PROPOFOL 500 MG/50ML IV EMUL
INTRAVENOUS | Status: DC | PRN
  Administered 2022-03-22: 150 ug/kg/min via INTRAVENOUS

## 2022-03-22 NOTE — Op Note (Signed)
[facility] Gastroenterology Patient Name: Jacqueline Aguilar Procedure Date: 03/22/2022 10:33 AM MRN: [id number] Account #: [id number] Date of Birth: 10/15/1945 Admit Type: Outpatient Age: 76 Room: [facility] ENDO ROOM 2 Gender: Female Note Status: Finalized Instrument Name: Colonoscope [id number] Procedure:             Colonoscopy Indications:           Screening for colorectal malignant neoplasm Providers:             Annamaria Helling DO, DO Medicines:             Monitored Anesthesia Care Complications:         No immediate complications. Estimated blood loss:                         Minimal. Procedure:             Pre-Anesthesia Assessment:                        - Prior to the procedure, a History and Physical was                         performed, and patient medications and allergies were                         reviewed. The patient is competent. The risks and                         benefits of the procedure and the sedation options and                         risks were discussed with the patient. All questions                         were answered and informed consent was obtained.                         Patient identification and proposed procedure were                         verified by the physician, the nurse, the anesthetist                         and the technician in the endoscopy suite. Mental                         Status Examination: alert and oriented. Airway                         Examination: normal oropharyngeal airway and neck                         mobility. Respiratory Examination: clear to                         auscultation. CV Examination: RRR, no murmurs, no S3                         or S4. Prophylactic Antibiotics: The patient does not  require prophylactic antibiotics. Prior                         Anticoagulants: The patient has taken no previous                         anticoagulant or antiplatelet agents.  ASA Grade                         Assessment: II - A patient with mild systemic disease.                         After reviewing the risks and benefits, the patient                         was deemed in satisfactory condition to undergo the                         procedure. The anesthesia plan was to use monitored                         anesthesia care (MAC). Immediately prior to                         administration of medications, the patient was                         re-assessed for adequacy to receive sedatives. The                         heart rate, respiratory rate, oxygen saturations,                         blood pressure, adequacy of pulmonary ventilation, and                         response to care were monitored throughout the                         procedure. The physical status of the patient was                         re-assessed after the procedure.                        After obtaining informed consent, the colonoscope was                         passed under direct vision. Throughout the procedure,                         the patient's blood pressure, pulse, and oxygen                         saturations were monitored continuously. The                         Colonoscope was introduced through the anus and  advanced to the the terminal ileum, with                         identification of the appendiceal orifice and IC                         valve. The colonoscopy was performed without                         difficulty. The patient tolerated the procedure well.                         The quality of the bowel preparation was evaluated                         using the BBPS [facility] Bowel Preparation Scale) with                         scores of: Right Colon = 3, Transverse Colon = 3 and                         Left Colon = 3 (entire mucosa seen well with no                         residual staining, small fragments of stool or opaque                          liquid). The total BBPS score equals 9. The terminal                         ileum, ileocecal valve, appendiceal orifice, and                         rectum were photographed. Findings:      The perianal and digital rectal examinations were normal. Pertinent       negatives include normal sphincter tone.      The terminal ileum appeared normal. Estimated blood loss: none.      Multiple small-mouthed diverticula were found in the left colon.       Estimated blood loss: none.      Non-bleeding internal hemorrhoids were found during retroflexion. The       hemorrhoids were Grade I (internal hemorrhoids that do not prolapse).       Estimated blood loss: none.      A 1 mm polyp was found in the rectum. The polyp was sessile. The polyp       was removed with a jumbo cold forceps. Resection and retrieval were       complete. Estimated blood loss was minimal.      The exam was otherwise without abnormality on direct and retroflexion       views. Impression:            - The examined portion of the ileum was normal.                        - Diverticulosis in the left colon.                        -  Non-bleeding internal hemorrhoids.                        - One 1 mm polyp in the rectum, removed with a jumbo                         cold forceps. Resected and retrieved.                        - The examination was otherwise normal on direct and                         retroflexion views. Recommendation:        - Patient has a contact number available for                         emergencies. The signs and symptoms of potential                         delayed complications were discussed with the patient.                         Return to normal activities tomorrow. Written                         discharge instructions were provided to the patient.                        - Discharge patient to home.                        - Resume previous diet.                        - Continue  present medications.                        - No aspirin, ibuprofen, naproxen, or other                         non-steroidal anti-inflammatory drugs for 5 days after                         polyp removal.                        - Await pathology results.                        - Repeat colonoscopy for surveillance based on                         pathology results.                        - Return to referring physician as previously                         scheduled.                        - The findings and recommendations  were discussed with                         the patient. Procedure Code(s):     --- Professional ---                        [phone], Colonoscopy, flexible; with biopsy, single or                         multiple Diagnosis Code(s):     --- Professional ---                        Z12.11, Encounter for screening for malignant neoplasm                         of colon                        K64.0, First degree hemorrhoids                        K62.1, Rectal polyp                        K57.30, Diverticulosis of large intestine without                         perforation or abscess without bleeding CPT copyright 2019 American Medical Association. All rights reserved. The codes documented in this report are preliminary and upon coder review may  be revised to meet current compliance requirements. Attending Participation:      I personally performed the entire procedure. Volney American, DO Annamaria Helling DO, DO 03/22/2022 11:11:16 AM This report has been signed electronically. Number of Addenda: 0 Note Initiated On: 03/22/2022 10:33 AM Scope Withdrawal Time: 0 hours 14 minutes 55 seconds  Total Procedure Duration: 0 hours 20 minutes 50 seconds  Estimated Blood Loss:  Estimated blood loss was minimal.      [facility]

## 2022-03-22 NOTE — Transfer of Care (Signed)
Immediate Anesthesia Transfer of Care Note  Patient: Jacqueline Aguilar  Procedure(s) Performed: COLONOSCOPY WITH PROPOFOL  Patient Location: Endoscopy Unit  Anesthesia Type:General  Level of Consciousness: drowsy and patient cooperative  Airway & Oxygen Therapy: Patient Spontanous Breathing  Post-op Assessment: Report given to RN and Post -op Vital signs reviewed and stable  Post vital signs: Reviewed and stable  Last Vitals:  Vitals Value Taken Time  BP 109/69 03/22/22 1111  Temp    Pulse 60 03/22/22 1111  Resp 22 03/22/22 1111  SpO2 98 % 03/22/22 1111  Vitals shown include unvalidated device data.  Last Pain:  Vitals:   03/22/22 0934  TempSrc: Temporal  PainSc: 0-No pain         Complications: No notable events documented.

## 2022-03-22 NOTE — Anesthesia Preprocedure Evaluation (Signed)
Anesthesia Evaluation  Patient identified by MRN, date of birth, ID band Patient awake    Reviewed: Allergy & Precautions, NPO status , Patient's Chart, lab work & pertinent test results  History of Anesthesia Complications (+) PONV and history of anesthetic complications  Airway Mallampati: III  TM Distance: >3 FB Neck ROM: full    Dental  (+) Lower Dentures, Upper Dentures   Pulmonary asthma ,    Pulmonary exam normal        Cardiovascular (-) hypertension(-) angina(-) Past MI negative cardio ROS Normal cardiovascular exam(-) pacemaker     Neuro/Psych negative neurological ROS  negative psych ROS   GI/Hepatic negative GI ROS, Neg liver ROS,   Endo/Other  negative endocrine ROS  Renal/GU negative Renal ROS  negative genitourinary   Musculoskeletal   Abdominal   Peds  Hematology negative hematology ROS (+)   Anesthesia Other Findings Past Medical History: No date: Anemia No date: Arthritis No date: Asthma No date: Glaucoma No date: PONV (postoperative nausea and vomiting)  Past Surgical History: No date: ABDOMINAL HYSTERECTOMY No date: BREAST CYST EXCISION; Left     Comment:  Years ago, No scar seen  2014: CHOLECYSTECTOMY No date: SHOULDER ARTHROSCOPY; Left 11/01/2019: SHOULDER ARTHROSCOPY WITH SUBACROMIAL DECOMPRESSION AND  OPEN ROTATOR C; Left     Comment:  Procedure: LEFT SHOULDER ARTHROSCOPY WITH DEBRIDEMENT,               DECOMPRESSION, BICEPS TENODESIS AND PROBABLE ROTATOR CUFF              REPAIR;  Surgeon: Christena Flake, MD;  Location: [facility] ORS;              Service: Orthopedics;  Laterality: Left;  BMI    Body Mass Index: 25.61 kg/m      Reproductive/Obstetrics negative OB ROS                             Anesthesia Physical Anesthesia Plan  ASA: 2  Anesthesia Plan: General   Post-op Pain Management: Minimal or no pain anticipated   Induction:  Intravenous  PONV Risk Score and Plan: 3 and Propofol infusion, TIVA and Ondansetron  Airway Management Planned: Nasal Cannula  Additional Equipment: None  Intra-op Plan:   Post-operative Plan:   Informed Consent: I have reviewed the patients History and Physical, chart, labs and discussed the procedure including the risks, benefits and alternatives for the proposed anesthesia with the patient or authorized representative who has indicated his/her understanding and acceptance.     Dental advisory given  Plan Discussed with: CRNA and Surgeon  Anesthesia Plan Comments: (Discussed risks of anesthesia with patient, including possibility of difficulty with spontaneous ventilation under anesthesia necessitating airway intervention, PONV, and rare risks such as cardiac or respiratory or neurological events, and allergic reactions. Discussed the role of CRNA in patient's perioperative care. Patient understands.)        Anesthesia Quick Evaluation

## 2022-03-22 NOTE — Interval H&P Note (Signed)
History and Physical Interval Note: Preprocedure H&P from 03/22/22  was reviewed and there was no interval change after seeing and examining the patient.  Written consent was obtained from the patient after discussion of risks, benefits, and alternatives. Patient has consented to proceed with Colonoscopy with possible intervention   03/22/2022 10:32 AM  Jacqueline Aguilar  has presented today for surgery, with the diagnosis of Z12.11 - Colon cancer screening.  The various methods of treatment have been discussed with the patient and family. After consideration of risks, benefits and other options for treatment, the patient has consented to  Procedure(s): COLONOSCOPY WITH PROPOFOL (N/A) as a surgical intervention.  The patient's history has been reviewed, patient examined, no change in status, stable for surgery.  I have reviewed the patient's chart and labs.  Questions were answered to the patient's satisfaction.     Jacqueline Aguilar

## 2022-03-23 ENCOUNTER — Encounter: Payer: Self-pay | Admitting: Gastroenterology

## 2022-03-23 LAB — SURGICAL PATHOLOGY

## 2022-03-23 NOTE — Anesthesia Postprocedure Evaluation (Signed)
Anesthesia Post Note  Patient: Jacqueline Aguilar  Procedure(s) Performed: COLONOSCOPY WITH PROPOFOL  Anesthesia Type: General Anesthetic complications: no   No notable events documented.   Last Vitals:  Vitals:   03/22/22 0934 03/22/22 1111  BP: (!) 140/76 109/69  Pulse: 68 (!) 58  Resp: 20 (!) 25  Temp: (!) 35.9 C 36.8 C  SpO2: 98% 98%    Last Pain:  Vitals:   03/23/22 0734  TempSrc:   PainSc: 0-No pain                 Stephanie Coup

## 2022-04-06 ENCOUNTER — Other Ambulatory Visit: Payer: Self-pay | Admitting: Nurse Practitioner

## 2022-04-06 DIAGNOSIS — Z1231 Encounter for screening mammogram for malignant neoplasm of breast: Secondary | ICD-10-CM

## 2022-05-17 ENCOUNTER — Ambulatory Visit
Admission: RE | Admit: 2022-05-17 | Discharge: 2022-05-17 | Disposition: A | Discharge status: Home / Self Care | Payer: Medicare Other | Source: Ambulatory Visit | Attending: Nurse Practitioner | Admitting: Nurse Practitioner

## 2022-05-17 DIAGNOSIS — Z1231 Encounter for screening mammogram for malignant neoplasm of breast: Secondary | ICD-10-CM

## 2022-08-29 ENCOUNTER — Emergency Department: Payer: Medicare Other

## 2022-08-29 ENCOUNTER — Other Ambulatory Visit: Payer: Self-pay

## 2022-08-29 ENCOUNTER — Emergency Department
Admission: EM | Admit: 2022-08-29 | Discharge: 2022-08-29 | Disposition: A | Discharge status: Home / Self Care | Payer: Medicare Other | Attending: Emergency Medicine | Admitting: Emergency Medicine

## 2022-08-29 DIAGNOSIS — J45909 Unspecified asthma, uncomplicated: Secondary | ICD-10-CM | POA: Diagnosis not present

## 2022-08-29 DIAGNOSIS — R1031 Right lower quadrant pain: Secondary | ICD-10-CM | POA: Insufficient documentation

## 2022-08-29 DIAGNOSIS — R109 Unspecified abdominal pain: Secondary | ICD-10-CM | POA: Diagnosis present

## 2022-08-29 LAB — COMPREHENSIVE METABOLIC PANEL
ALT: 22 U/L (ref 0–44)
AST: 25 U/L (ref 15–41)
Albumin: 4.5 g/dL (ref 3.5–5.0)
Alkaline Phosphatase: 78 U/L (ref 38–126)
Anion gap: 8 (ref 5–15)
BUN: 9 mg/dL (ref 8–23)
CO2: 25 mmol/L (ref 22–32)
Calcium: 9.4 mg/dL (ref 8.9–10.3)
Chloride: 105 mmol/L (ref 98–111)
Creatinine, Ser: 0.55 mg/dL (ref 0.44–1.00)
GFR, Estimated: >60 mL/min (ref >60)
Glucose, Bld: 105 mg/dL — ABNORMAL HIGH (ref 70–99)
Potassium: 3.8 mmol/L (ref 3.5–5.1)
Sodium: 138 mmol/L (ref 135–145)
Total Bilirubin: 0.8 mg/dL (ref 0.3–1.2)
Total Protein: 7.5 g/dL (ref 6.5–8.1)

## 2022-08-29 LAB — CBC
HCT: 41 % (ref 36.0–46.0)
Hemoglobin: 13.1 g/dL (ref 12.0–15.0)
MCH: 29.4 pg (ref 26.0–34.0)
MCHC: 32 g/dL (ref 30.0–36.0)
MCV: 92.1 fL (ref 80.0–100.0)
Platelets: 218 10*3/uL (ref 150–400)
RBC: 4.45 MIL/uL (ref 3.87–5.11)
RDW: 12.7 % (ref 11.5–15.5)
WBC: 4.6 10*3/uL (ref 4.0–10.5)
nRBC: 0 % (ref 0.0–0.2)

## 2022-08-29 LAB — URINALYSIS, ROUTINE W REFLEX MICROSCOPIC
Bilirubin Urine: NEGATIVE
Glucose, UA: NEGATIVE mg/dL
Hgb urine dipstick: NEGATIVE
Ketones, ur: NEGATIVE mg/dL
Leukocytes,Ua: NEGATIVE
Nitrite: NEGATIVE
Protein, ur: NEGATIVE mg/dL
Specific Gravity, Urine: 1.016 (ref 1.005–1.030)
pH: 5 (ref 5.0–8.0)

## 2022-08-29 LAB — LIPASE, BLOOD: Lipase: 58 U/L — ABNORMAL HIGH (ref 11–51)

## 2022-08-29 MED ORDER — IOHEXOL 300 MG/ML  SOLN
100.0000 mL | Freq: Once | INTRAMUSCULAR | Status: AC | PRN
  Administered 2022-08-29: 100 mL via INTRAVENOUS

## 2022-08-29 MED ORDER — METHYLPREDNISOLONE 4 MG PO TBPK: ORAL_TABLET | ORAL | 0 refills | Status: AC

## 2022-08-29 NOTE — Discharge Instructions (Addendum)
Take Medrol Dosepak as directed. If your symptoms do not improve, please follow-up with primary care.

## 2022-08-29 NOTE — ED Provider Notes (Signed)
  Physical Exam  BP (!) 165/78   Pulse 74   Temp 97.9 F (36.6 C) (Oral)   Resp 18   Ht 5\' 1"  (1.549 m)   Wt 65.3 kg   SpO2 99%   BMI 27.21 kg/m   Physical Exam  Procedures  Procedures  ED Course / MDM    Medical Decision Making Amount and/or Complexity of Data Reviewed Labs: ordered. Radiology: ordered.  Risk Prescription drug management.   Assumed patient care from Blake Divine, MD.  CT abdomen pelvis not suggestive of appendicitis although appendix was not well-visualized.  I reexamined patient and she denied vomiting, persistent nausea or fever, decreasing suspicion for appendicitis.  Patient did localize her pain primarily to the right-sided lumbar spine with radiation to the groin.  Suspicious for lumbar radiculopathy at this time.  Patient was prescribed Medrol Dosepak.  Patient states that she is already had a dose of Toradol earlier today.  Return precautions were given to return with new or worsening symptoms.  All patient questions were answered.       Vallarie Mare Harris, Hershal Coria 08/29/22 2056    Blake Divine, MD 08/30/22 [phone]

## 2022-08-29 NOTE — ED Triage Notes (Signed)
Pt to ED after going to walk in clinic, told to come here for RLQ abdominal pain, was told may be renal stone. Denies urinary symptoms. Pain goes from RLQ through groin, also has R lower back pain. Pain started 3 days ago. Does not know whether ever had appendix removed.

## 2022-08-29 NOTE — ED Provider Notes (Signed)
[facility] ([facility]) Provider Note    Event Date/Time   First MD Initiated Contact with Patient 08/29/22 1847     (approximate)   History   Chief Complaint Abdominal Pain (RLQ) and Flank Pain   HPI  Jacqueline Aguilar is a 77 y.o. female with past medical history of asthma and anemia who presents to the ED complaining of abdominal pain.  Patient reports that she has been having 3 days of pain in the right lower quadrant of her abdomen.  She describes the pain as sharp and constant, not exacerbated or alleviated by anything in particular.  Pain radiates towards her right lower back as well as down towards her right knee.  She denies any trauma to her hip or back recently.  She has had occasional urinary frequency and urgency, denies any dysuria or hematuria.  She has not had any fevers or flank pain and denies any nausea, vomiting, or diarrhea.  She is status post hysterectomy but believes she still has her appendix.     Physical Exam   Triage Vital Signs: ED Triage Vitals  Enc Vitals Group     BP 08/29/22 1728 (!) 163/72     Pulse Rate 08/29/22 1728 71     Resp 08/29/22 1728 16     Temp 08/29/22 1728 97.8 F (36.6 C)     Temp Source 08/29/22 1728 Oral     SpO2 08/29/22 1728 98 %     Weight 08/29/22 1729 144 lb (65.3 kg)     Height 08/29/22 1729 5\' 1"  (1.549 m)     Head Circumference --      Peak Flow --      Pain Score 08/29/22 1728 6     Pain Loc --      Pain Edu? --      Excl. in [place]? --     Most recent vital signs: Vitals:   08/29/22 1728  BP: (!) 163/72  Pulse: 71  Resp: 16  Temp: 97.8 F (36.6 C)  SpO2: 98%    Constitutional: Alert and oriented. Eyes: Conjunctivae are normal. Head: Atraumatic. Nose: No congestion/rhinnorhea. Mouth/Throat: Mucous membranes are moist.  Cardiovascular: Normal rate, regular rhythm. Grossly normal heart sounds.  2+ radial and DP pulses bilaterally. Respiratory: Normal respiratory effort.  No retractions. Lungs  CTAB. Gastrointestinal: Soft and tender to palpation in the right lower quadrant with no rebound or guarding.. No distention. Musculoskeletal: No lower extremity tenderness nor edema.  Tenderness to palpation over right lateral lumbar spine, no midline tenderness to palpation noted. Neurologic:  Normal speech and language. No gross focal neurologic deficits are appreciated.    ED Results / Procedures / Treatments   Labs (all labs ordered are listed, but only abnormal results are displayed) Labs Reviewed  LIPASE, BLOOD - Abnormal; Notable for the following components:      Result Value   Lipase 58 (*)    All other components within normal limits  COMPREHENSIVE METABOLIC PANEL - Abnormal; Notable for the following components:   Glucose, Bld 105 (*)    All other components within normal limits  URINALYSIS, ROUTINE W REFLEX MICROSCOPIC - Abnormal; Notable for the following components:   Color, Urine YELLOW (*)    APPearance CLEAR (*)    All other components within normal limits  CBC    PROCEDURES:  Critical Care performed: No  Procedures   MEDICATIONS ORDERED IN ED: Medications - No data to display   IMPRESSION / MDM /  ASSESSMENT AND PLAN / ED COURSE  I reviewed the triage vital signs and the nursing notes.                              77 y.o. female with past medical history of asthma and anemia who presents to the ED complaining of right lower quadrant abdominal pain for the past 3 days radiating towards her back and down towards her right knee.   Patient's presentation is most consistent with acute presentation with potential threat to life or bodily function.  Differential diagnosis includes, but is not limited to, appendicitis, ovarian cyst, kidney stone, pyelonephritis, cystitis, lumbar radiculopathy.  Patient well-appearing and in no acute distress, vital signs are unremarkable.  Pain is reproducible with palpation of her right lower quadrant as well as her right  lateral lower back.  She remains neurovascular intact to her right lower extremity, no findings concerning for cauda equina.  We will further assess with CT imaging for appendicitis or other intra-abdominal process.  Urinalysis shows no signs of infection and labs are reassuring with no significant anemia, leukocytosis, electrolyte abnormality, or AKI.  LFTs and lipase are unremarkable, patient declines pain medication at this time.  Patient turned over to oncoming provider pending imaging results and reassessment.      FINAL CLINICAL IMPRESSION(S) / ED DIAGNOSES   Final diagnoses:  RLQ abdominal pain     Rx / DC Orders   ED Discharge Orders     None        Note:  This document was prepared using Dragon voice recognition software and may include unintentional dictation errors.   Blake Divine, MD 08/29/22 [phone]

## 2023-04-11 ENCOUNTER — Other Ambulatory Visit: Payer: Self-pay | Admitting: Physician Assistant

## 2023-04-11 DIAGNOSIS — Z1231 Encounter for screening mammogram for malignant neoplasm of breast: Secondary | ICD-10-CM

## 2023-06-10 ENCOUNTER — Ambulatory Visit
Admission: RE | Admit: 2023-06-10 | Discharge: 2023-06-10 | Disposition: A | Discharge status: Home / Self Care | Payer: Medicare Other | Source: Ambulatory Visit | Attending: Physician Assistant | Admitting: Physician Assistant

## 2023-06-10 DIAGNOSIS — Z1231 Encounter for screening mammogram for malignant neoplasm of breast: Secondary | ICD-10-CM

## 2024-05-07 ENCOUNTER — Other Ambulatory Visit: Payer: Self-pay | Admitting: Physician Assistant

## 2024-05-07 DIAGNOSIS — Z0000 Encounter for general adult medical examination without abnormal findings: Secondary | ICD-10-CM

## 2024-06-11 ENCOUNTER — Ambulatory Visit
Admission: RE | Admit: 2024-06-11 | Discharge: 2024-06-11 | Disposition: A | Discharge status: Home / Self Care | Source: Ambulatory Visit | Attending: Physician Assistant | Admitting: Physician Assistant

## 2024-06-11 DIAGNOSIS — Z0000 Encounter for general adult medical examination without abnormal findings: Secondary | ICD-10-CM
